# Patient Record
Sex: Female | Born: 1960 | Race: White | Hispanic: No | Marital: Single | Smoking: Never smoker
Health system: Southern US, Community
[De-identification: ages and names within clinical notes are randomized; demographics above are authoritative.]

## PROBLEM LIST (undated history)

## (undated) DIAGNOSIS — I10 Essential (primary) hypertension: Secondary | ICD-10-CM

## (undated) DIAGNOSIS — N200 Calculus of kidney: Secondary | ICD-10-CM

## (undated) HISTORY — PX: TUBAL LIGATION: SHX77

## (undated) HISTORY — PX: OTHER SURGICAL HISTORY: SHX169

---

## 2006-01-05 ENCOUNTER — Ambulatory Visit: Payer: Self-pay | Admitting: Internal Medicine

## 2007-07-24 ENCOUNTER — Ambulatory Visit: Payer: Self-pay | Admitting: Internal Medicine

## 2007-08-15 ENCOUNTER — Ambulatory Visit: Payer: Self-pay | Admitting: Urology

## 2007-09-19 ENCOUNTER — Ambulatory Visit: Payer: Self-pay | Admitting: Urology

## 2009-01-03 IMAGING — CR DG ABDOMEN 1V
1 series · 2 of 2 positions shown · non-contrast
Comparison: none

REASON FOR EXAM: renal calculi-lithotripsy
COMMENTS:

[Series 1: view not recorded · 0.17mm/px · 2 of 2 slices shown]
[im 1/2]
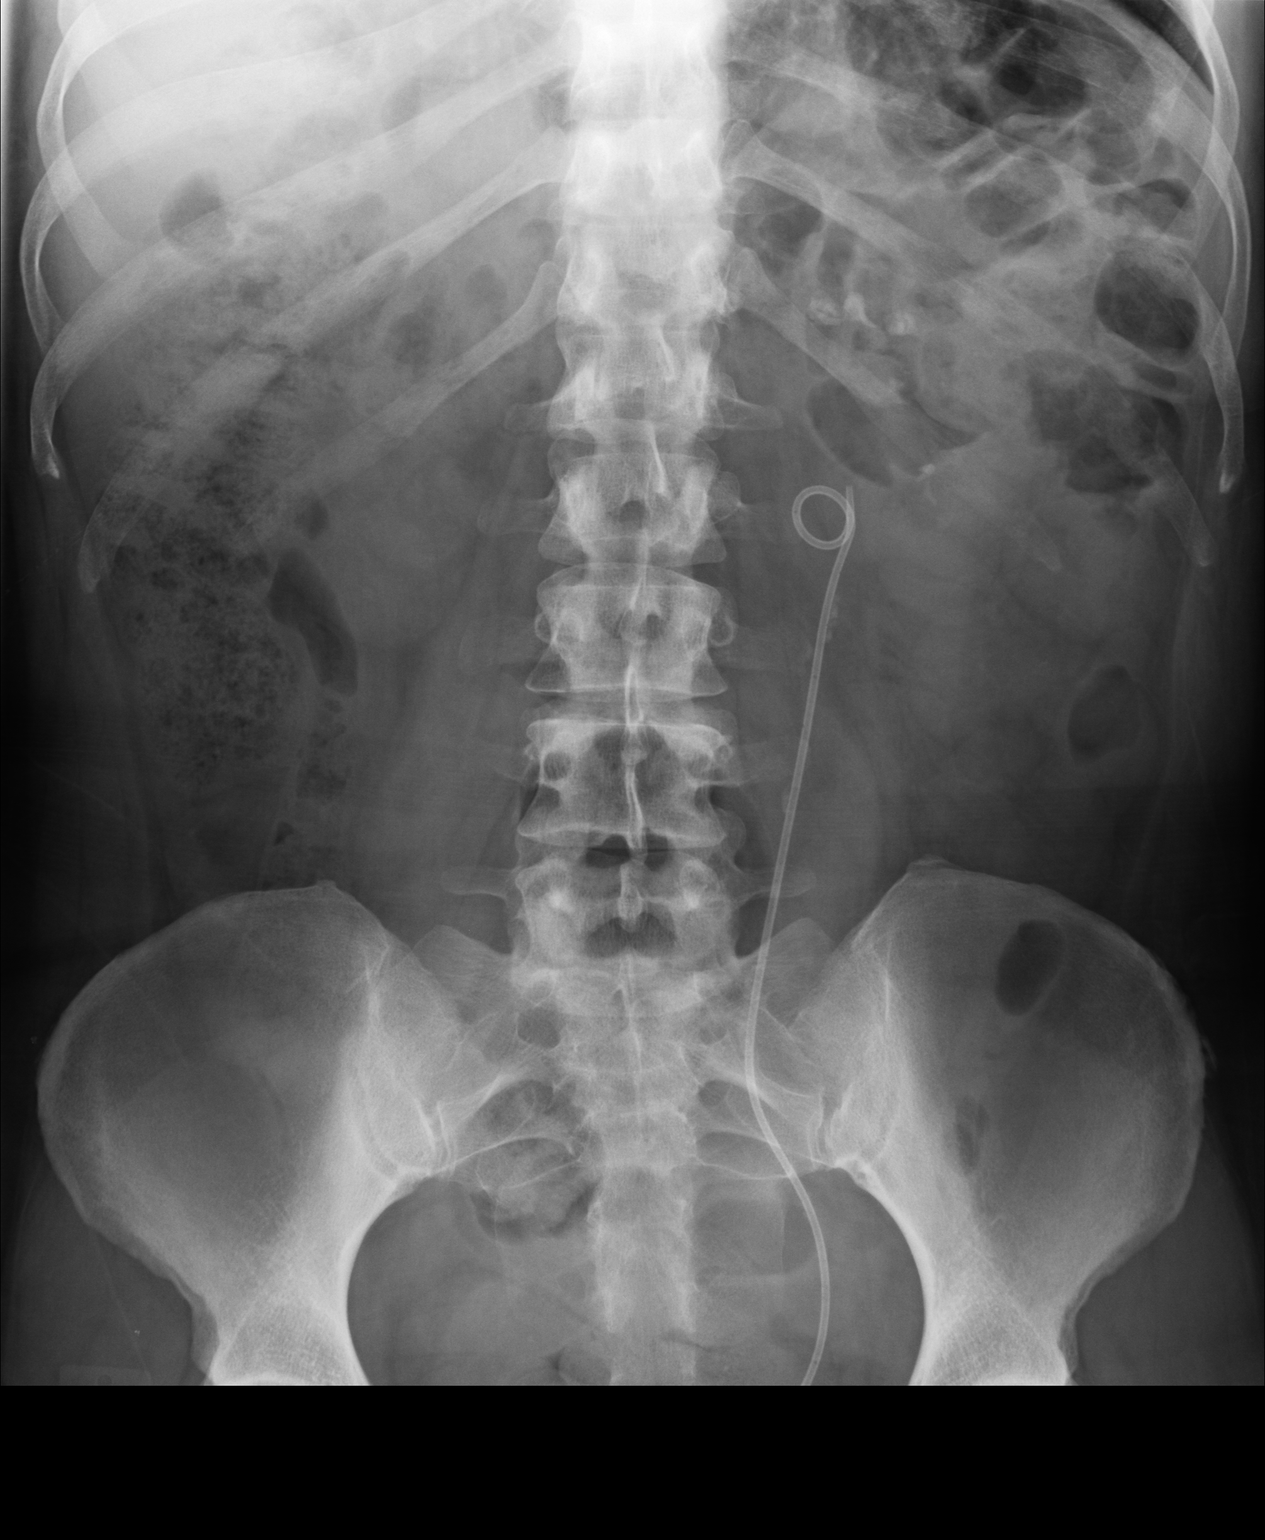
[im 2/2]
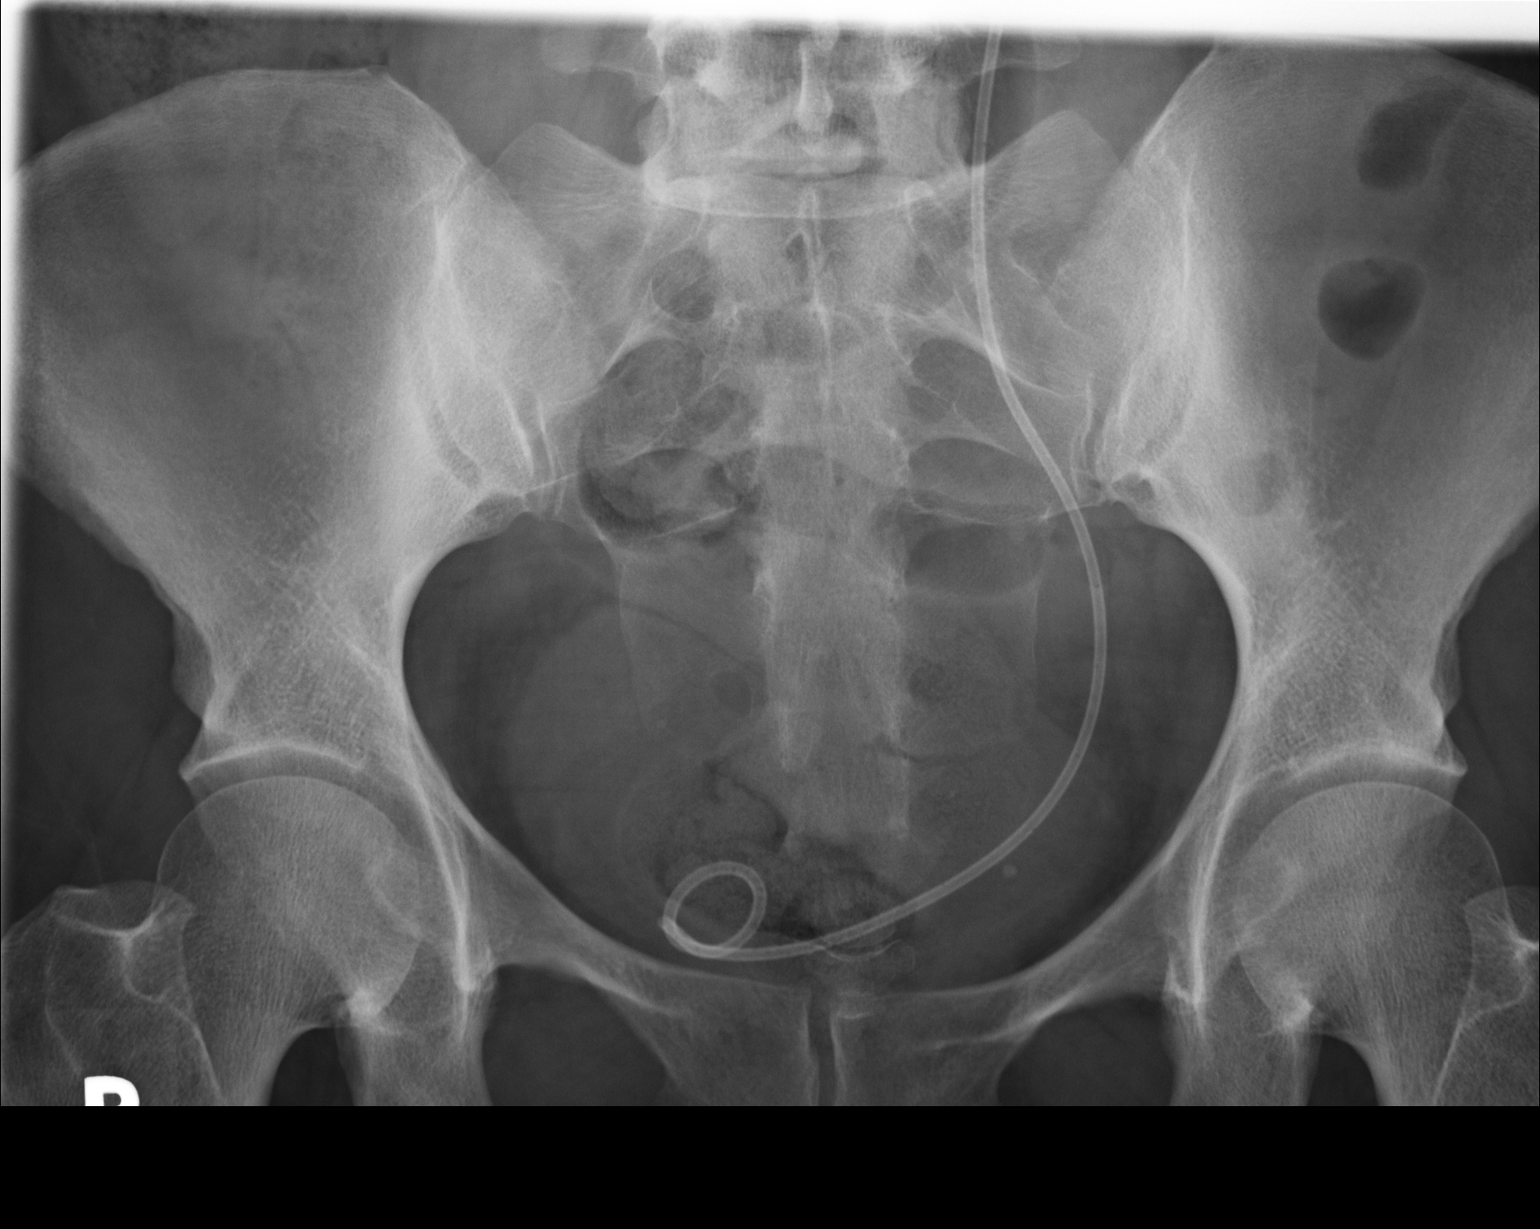

[2 of 2 positions shown; findings below may reference images not displayed]

PROCEDURE:     DXR - DXR KIDNEY URETER BLADDER  - September 19, 2007  [DATE]

RESULT:     Comparison is made to a prior exam of 08/15/2007.  A LEFT
ureteral stent is present. There are noted a few calcifications adjacent to
the stent at the level of the L3 lumbar transverse process and inferior to
the L4 lumbar transverse process. The staghorn calcification at the upper
pole of the LEFT kidney is less prominent although residual calcifications
remain. Additionally, there are a few calcifications noted in the lower pole
region of the LEFT kidney compatible with stone fragments. No renal
calcifications or ureteral calcifications are seen on the RIGHT.
IMPRESSION: 1.     Please see above.

## 2009-07-30 ENCOUNTER — Ambulatory Visit: Payer: Self-pay | Admitting: Internal Medicine

## 2010-02-21 ENCOUNTER — Ambulatory Visit: Payer: Self-pay | Admitting: Internal Medicine

## 2010-06-17 DIAGNOSIS — F3160 Bipolar disorder, current episode mixed, unspecified: Secondary | ICD-10-CM | POA: Insufficient documentation

## 2010-06-17 DIAGNOSIS — G43909 Migraine, unspecified, not intractable, without status migrainosus: Secondary | ICD-10-CM | POA: Insufficient documentation

## 2011-08-28 ENCOUNTER — Ambulatory Visit: Payer: Self-pay

## 2011-08-28 LAB — URINALYSIS, COMPLETE
Ketone: NEGATIVE
Nitrite: NEGATIVE
Ph: 7 (ref 4.5–8.0)
Protein: NEGATIVE
Specific Gravity: 1.005 (ref 1.003–1.030)

## 2011-08-29 LAB — URINE CULTURE

## 2013-07-26 ENCOUNTER — Ambulatory Visit: Payer: Self-pay | Admitting: Family Medicine

## 2013-07-26 LAB — URINALYSIS, COMPLETE
BILIRUBIN, UR: NEGATIVE
GLUCOSE, UR: NEGATIVE mg/dL (ref 0–75)
Ketone: NEGATIVE
NITRITE: POSITIVE
Ph: 7.5 (ref 4.5–8.0)
Protein: NEGATIVE
SPECIFIC GRAVITY: 1.01 (ref 1.003–1.030)
WBC UR: >30 /HPF (ref 0–5)

## 2013-07-28 LAB — URINE CULTURE

## 2014-02-11 ENCOUNTER — Ambulatory Visit: Payer: Self-pay | Admitting: Obstetrics and Gynecology

## 2014-02-11 LAB — CBC
HCT: 38.8 % (ref 35.0–47.0)
HGB: 12.4 g/dL (ref 12.0–16.0)
MCH: 29.3 pg (ref 26.0–34.0)
MCHC: 31.9 g/dL — ABNORMAL LOW (ref 32.0–36.0)
MCV: 92 fL (ref 80–100)
Platelet: 306 10*3/uL (ref 150–440)
RBC: 4.22 10*6/uL (ref 3.80–5.20)
RDW: 13.8 % (ref 11.5–14.5)
WBC: 5.8 10*3/uL (ref 3.6–11.0)

## 2014-02-11 LAB — COMPREHENSIVE METABOLIC PANEL
ALBUMIN: 3.5 g/dL (ref 3.4–5.0)
AST: 15 U/L (ref 15–37)
Alkaline Phosphatase: 74 U/L
Anion Gap: 7 (ref 7–16)
BILIRUBIN TOTAL: 0.4 mg/dL (ref 0.2–1.0)
BUN: 12 mg/dL (ref 7–18)
CHLORIDE: 109 mmol/L — AB (ref 98–107)
CO2: 25 mmol/L (ref 21–32)
Calcium, Total: 8.8 mg/dL (ref 8.5–10.1)
Creatinine: 0.9 mg/dL (ref 0.60–1.30)
EGFR (African American): >60
EGFR (Non-African Amer.): >60
Glucose: 89 mg/dL (ref 65–99)
Osmolality: 280 (ref 275–301)
POTASSIUM: 4 mmol/L (ref 3.5–5.1)
SGPT (ALT): 20 U/L
SODIUM: 141 mmol/L (ref 136–145)
TOTAL PROTEIN: 7.6 g/dL (ref 6.4–8.2)

## 2014-02-19 ENCOUNTER — Ambulatory Visit: Payer: Self-pay | Admitting: Obstetrics and Gynecology

## 2014-02-26 LAB — PATHOLOGY REPORT

## 2014-03-05 LAB — HM COLONOSCOPY

## 2014-05-29 ENCOUNTER — Emergency Department: Payer: Self-pay | Admitting: Emergency Medicine

## 2014-10-14 ENCOUNTER — Emergency Department
Admit: 2014-10-14 | Disposition: A | Discharge status: Home / Self Care | Payer: Self-pay | Admitting: Emergency Medicine

## 2014-11-02 ENCOUNTER — Ambulatory Visit
Admit: 2014-11-02 | Disposition: A | Discharge status: Home / Self Care | Payer: Self-pay | Attending: Family Medicine | Admitting: Family Medicine

## 2014-11-02 LAB — URINALYSIS, COMPLETE
Bilirubin,UR: NEGATIVE
Glucose,UR: NEGATIVE
Ketone: NEGATIVE
NITRITE: NEGATIVE
Ph: 5.5 (ref 5.0–8.0)
Protein: NEGATIVE
Specific Gravity: 1.02 (ref 1.000–1.030)

## 2014-11-04 LAB — URINE CULTURE

## 2014-11-07 NOTE — Op Note (Signed)
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   PROCEDURE: Loop electrosurgical excisional procedure.   ANESTHESIA: General.   SURGEON:  Conard NovakStephen D. Jakyle Petrucelli, Welch.D.   ANESTHESIA: General.   ESTIMATED BLOOD LOSS: Minimal.   OPERATIVE FLUIDS: 600 mL crystalloid.   COMPLICATIONS: None.   FINDINGS: Small area of Lugol's negative cervix at about 12:00.   SPECIMENS: 1. Ectocervix.  2. Endocervix.  3. Endocervical curettings.   CONDITION AT THE END OF THE PROCEDURE: Stable.   PROCEDURE IN DETAIL: The patient was taken to the Operating Room where general anesthesia was administered and found to be adequate. The patient was placed in the dorsal supine high lithotomy position in candy cane stirrups with great care to prevent nerve damage. She was prepped and draped in the usual sterile fashion. After a timeout was called, her bladder was emptied using in and out catheterization for about 100 mL of clear urine. A sterile speculum was placed in the vagina, that is insulated. Lugol's was applied to the cervix with the above-noted findings. The cervix was then injected for a circumferential cervical block using 1% lidocaine with epinephrine, for a total of about 10 mL. Using a large loop, ectocervical specimen was taken in the usual fashion. The 12 o'clock portion of the specimen was tagged with a suture. Endocervical sample was taken using the small wire loop. Endocervical curettings were undertaken using the curette as well as a sterile Q-tip. The cervical LEEP bed was then copiously coagulated using electrocautery with no apparent bleeding. Monsel solution was then applied to the cone bed and no bleeding was evident after watching the cervix for approximately 5 minutes. The  speculum was removed and the vagina was ensured to be clear of all instrumentation, and  sponges as well.   The patient tolerated the procedure well. Sponge, lap, and needle counts were correct x2. For VTE prophylaxis, the patient was wearing pneumatic compression stockings throughout the entire procedure. She was awakened in the Operating Room and taken to the recovery area in stable condition.   ____________________________ Conard NovakStephen D. Jaquail Mclees, MD sdj:dw D: 02/19/2014 14:48:17 ET T: 02/19/2014 19:34:43 ET JOB#: [id number]  cc: Conard NovakStephen D. Awanda Wilcock, MD, <Dictator> Conard NovakSTEPHEN D Azure Budnick MD ELECTRONICALLY SIGNED 04/01/2014 14:45

## 2014-11-25 ENCOUNTER — Other Ambulatory Visit: Payer: Self-pay

## 2014-11-25 DIAGNOSIS — Z1231 Encounter for screening mammogram for malignant neoplasm of breast: Secondary | ICD-10-CM

## 2014-12-10 ENCOUNTER — Ambulatory Visit: Payer: Self-pay

## 2014-12-22 ENCOUNTER — Ambulatory Visit
Admission: RE | Admit: 2014-12-22 | Discharge: 2014-12-22 | Disposition: A | Discharge status: Home / Self Care | Payer: Medicare Other | Source: Ambulatory Visit | Attending: Internal Medicine | Admitting: Internal Medicine

## 2014-12-22 DIAGNOSIS — Z1231 Encounter for screening mammogram for malignant neoplasm of breast: Secondary | ICD-10-CM | POA: Diagnosis not present

## 2015-07-30 ENCOUNTER — Ambulatory Visit
Admission: EM | Admit: 2015-07-30 | Discharge: 2015-07-30 | Disposition: A | Discharge status: Home / Self Care | Payer: Medicare Other | Attending: Family Medicine | Admitting: Family Medicine

## 2015-07-30 DIAGNOSIS — N390 Urinary tract infection, site not specified: Secondary | ICD-10-CM

## 2015-07-30 HISTORY — DX: Calculus of kidney: N20.0

## 2015-07-30 LAB — URINALYSIS COMPLETE WITH MICROSCOPIC (ARMC ONLY)
Bilirubin Urine: NEGATIVE
Glucose, UA: NEGATIVE mg/dL
KETONES UR: NEGATIVE mg/dL
NITRITE: POSITIVE — AB
PH: 6 (ref 5.0–8.0)
Protein, ur: NEGATIVE mg/dL
Specific Gravity, Urine: 1.02 (ref 1.005–1.030)

## 2015-07-30 MED ORDER — NITROFURANTOIN MONOHYD MACRO 100 MG PO CAPS: 100.0000 mg | ORAL_CAPSULE | Freq: Two times a day (BID) | ORAL | Status: DC

## 2015-07-30 NOTE — ED Notes (Signed)
States has Hx of both UTI and Genital Herpes. + burning with voiding

## 2015-07-30 NOTE — ED Provider Notes (Signed)
[facility]  ____________________________________________  Time seen: Approximately 9:53 AM  I have reviewed the triage vital signs and the nursing notes.   HISTORY  Chief Complaint Urinary Tract Infection   HPI Isabel Welch is a 55 y.o. female presents for the complaints of urinary frequency, urinary urgency as well some intermittent burning with urination for the past 4-5 days. Patient states that she feels like she is having he more frequently and has to get to the bathroom quicker. Denies complaints in absence of having to urinate. Reports that she has had a urinary tract infection the past that felt similar. Patient denies any recent urinary tract infections. Denies any recent antibiotic use. Denies any vaginal bleeding, vaginal odor, vaginal discharge. Denies any vaginal rash or lesions. Patient reports that she does have a history of genital herpes but reports that this does not feel like that, patient again denies any rash or lesions. Denies concerns of STDs. Denies chance of pregnancy.  Denies abdominal pain, back pain, nausea, vomiting, fevers, weakness, chest pain or shortness of breath. Reports continues to eat and drink well.  PCP: Welton FlakesKhan   Past Medical History  Diagnosis Date  . Kidney stone     There are no active problems to display for this patient.   Past Surgical History  Procedure Laterality Date  . Leap    . Tubal ligation    . Gallbladder stents      Current Outpatient Rx  Name  Route  Sig  Dispense  Refill  .             Allergies Review of patient's allergies indicates no known allergies.  Family History  Problem Relation Age of Onset  . Cancer Mother   . Heart failure Father     Social History Social History  Substance Use Topics  . Smoking status: Never Smoker   . Smokeless tobacco: None  . Alcohol Use: No    Review of Systems Constitutional: No fever/chills Eyes: No visual changes. ENT: No sore  throat. Cardiovascular: Denies chest pain. Respiratory: Denies shortness of breath. Gastrointestinal: No abdominal pain.  No nausea, no vomiting.  No diarrhea.  No constipation. Genitourinary: Positive for dysuria. Musculoskeletal: Negative for back pain. Skin: Negative for rash. Neurological: Negative for headaches, focal weakness or numbness.  10-point ROS otherwise negative.  ____________________________________________   PHYSICAL EXAM:  VITAL SIGNS: ED Triage Vitals  Enc Vitals Group     BP 07/30/15 0927 115/62 mmHg     Pulse Rate 07/30/15 0927 79     Resp 07/30/15 0927 16     Temp 07/30/15 0927 98.1 F (36.7 C)     Temp Source 07/30/15 0927 Tympanic     SpO2 07/30/15 0927 100 %     Weight 07/30/15 0927 174 lb (78.926 kg)     Height 07/30/15 0927 5\' 3"  (1.6 m)     Head Cir --      Peak Flow --      Pain Score --      Pain Loc --      Pain Edu? --      Excl. in GC? --     Constitutional: Alert and oriented. Well appearing and in no acute distress. Eyes: Conjunctivae are normal. PERRL. EOMI. Head: Atraumatic.  Nose: No congestion/rhinnorhea.  Mouth/Throat: Mucous membranes are moist. Hematological/Lymphatic/Immunilogical: No cervical lymphadenopathy. Cardiovascular: Normal rate, regular rhythm. Grossly normal heart sounds.  Good peripheral circulation. Respiratory: Normal respiratory effort.  No retractions. Lungs CTAB.  Gastrointestinal: Soft and nontender. No distention. Normal Bowel sounds. . No CVA tenderness. Pelvic: Patient refused. Musculoskeletal: No lower or upper extremity tenderness nor edema.  No cervical, thoracic or lumbar tenderness to palpation. Neurologic:  Normal speech and language. No gross focal neurologic deficits are appreciated. No gait instability. Skin:  Skin is warm, dry and intact. No rash noted. Psychiatric: Mood and affect are normal. Speech and behavior are normal.  ____________________________________________   LABS (all labs  ordered are listed, but only abnormal results are displayed)  Labs Reviewed  URINALYSIS COMPLETEWITH MICROSCOPIC ([facility] ONLY) - Abnormal; Notable for the following:    APPearance HAZY (*)    Hgb urine dipstick TRACE (*)    Nitrite POSITIVE (*)    Leukocytes, UA TRACE (*)    Bacteria, UA MANY (*)    Squamous Epithelial / LPF 0-5 (*)    All other components within normal limits  URINE CULTURE    INITIAL IMPRESSION / ASSESSMENT AND PLAN / ED COURSE  Pertinent labs & imaging results that were available during my care of the patient were reviewed by me and considered in my medical decision making (see chart for details).  Very well-appearing patient. No acute distress. Presents for the complaints of urinary dysuria 4-5 days. Denies fevers. Denies vaginal complaints, vaginal rash or lesions. Denies abdominal pain. Reports continues to eat and drink well. Abdomen soft and nontender. Moist mucous membranes. Urinalysis positive for many bacteria, trace leukocytes, positive nitrite. Will culture urine. Will treat urinary tract infection with oral Macrobid, encouraged rest, fluids and PCP follow up. Patient follow-up next week with PCP to ensure clearance of urinary tract infection.  Discussed follow up with Primary care physician this week. Discussed follow up and return parameters including no resolution or any worsening concerns. Patient verbalized understanding and agreed to plan.   ____________________________________________   FINAL CLINICAL IMPRESSION(S) / ED DIAGNOSES  Final diagnoses:  UTI (lower urinary tract infection)       Renford Dills, NP 07/30/15 1028

## 2015-07-30 NOTE — Discharge Instructions (Signed)
Rest. Drink plenty of fluids.   Follow up with your primary care physician next week as discussed. Return to Urgent care for new or worsening concerns.   Urinary Tract Infection Urinary tract infections (UTIs) can develop anywhere along your urinary tract. Your urinary tract is your body's drainage system for removing wastes and extra water. Your urinary tract includes two kidneys, two ureters, a bladder, and a urethra. Your kidneys are a pair of bean-shaped organs. Each kidney is about the size of your fist. They are located below your ribs, one on each side of your spine. CAUSES Infections are caused by microbes, which are microscopic organisms, including fungi, viruses, and bacteria. These organisms are so small that they can only be seen through a microscope. Bacteria are the microbes that most commonly cause UTIs. SYMPTOMS  Symptoms of UTIs may vary by age and gender of the patient and by the location of the infection. Symptoms in young women typically include a frequent and intense urge to urinate and a painful, burning feeling in the bladder or urethra during urination. Older women and men are more likely to be tired, shaky, and weak and have muscle aches and abdominal pain. A fever may mean the infection is in your kidneys. Other symptoms of a kidney infection include pain in your back or sides below the ribs, nausea, and vomiting. DIAGNOSIS To diagnose a UTI, your caregiver will ask you about your symptoms. Your caregiver will also ask you to provide a urine sample. The urine sample will be tested for bacteria and white blood cells. White blood cells are made by your body to help fight infection. TREATMENT  Typically, UTIs can be treated with medication. Because most UTIs are caused by a bacterial infection, they usually can be treated with the use of antibiotics. The choice of antibiotic and length of treatment depend on your symptoms and the type of bacteria causing your infection. HOME CARE  INSTRUCTIONS  If you were prescribed antibiotics, take them exactly as your caregiver instructs you. Finish the medication even if you feel better after you have only taken some of the medication.  Drink enough water and fluids to keep your urine clear or pale yellow.  Avoid caffeine, tea, and carbonated beverages. They tend to irritate your bladder.  Empty your bladder often. Avoid holding urine for long periods of time.  Empty your bladder before and after sexual intercourse.  After a bowel movement, women should cleanse from front to back. Use each tissue only once. SEEK MEDICAL CARE IF:   You have back pain.  You develop a fever.  Your symptoms do not begin to resolve within 3 days. SEEK IMMEDIATE MEDICAL CARE IF:   You have severe back pain or lower abdominal pain.  You develop chills.  You have nausea or vomiting.  You have continued burning or discomfort with urination. MAKE SURE YOU:   Understand these instructions.  Will watch your condition.  Will get help right away if you are not doing well or get worse.   This information is not intended to replace advice given to you by your health care provider. Make sure you discuss any questions you have with your health care provider.   Document Released: 04/12/2005 Document Revised: 03/24/2015 Document Reviewed: 08/11/2011 Elsevier Interactive Patient Education [employer]2016 Elsevier Inc.

## 2015-08-01 LAB — URINE CULTURE: SPECIAL REQUESTS: NORMAL

## 2015-12-02 ENCOUNTER — Encounter: Payer: Self-pay | Admitting: Podiatry

## 2015-12-02 ENCOUNTER — Ambulatory Visit (INDEPENDENT_AMBULATORY_CARE_PROVIDER_SITE_OTHER): Payer: Medicare Other | Admitting: Podiatry

## 2015-12-02 VITALS — BP 141/89 | HR 70 | Resp 18

## 2015-12-02 DIAGNOSIS — L603 Nail dystrophy: Secondary | ICD-10-CM | POA: Diagnosis not present

## 2015-12-02 MED ORDER — UREA 40 % EX GEL: 1.0000 "application " | Freq: Every day | CUTANEOUS | Status: DC

## 2015-12-02 NOTE — Progress Notes (Signed)
   Subjective:    Patient ID: Isabel Welch, female    DOB: 09-20-1960, 55 y.o.   MRN: [id number]  HPI  55 year old female presents the office they for concerns of thick, painful, elongated toenails right second digit toenail. She has tried over-the-counter treatment any relief. Should discuss treatment options for the thick toenails. No swelling redness or drainage. No other complaints.   Review of Systems  All other systems reviewed and are negative.      Objective:   Physical Exam General: AAO x3, NAD  Dermatological: Nails are very somewhat dystrophic, discolored, hypertrophic particularly the right second digit toenail. This tenderness to the nails. No surrounding redness or drainage. No open lesions.  Vascular: Dorsalis Pedis artery and Posterior Tibial artery pedal pulses are 2/4 bilateral with immedate capillary fill time. Pedal hair growth present. No varicosities and no lower extremity edema present bilateral. There is no pain with calf compression, swelling, warmth, erythema.   Neruologic: Grossly intact via light touch bilateral. Vibratory intact via tuning fork bilateral. Protective threshold with Semmes Wienstein monofilament intact to all pedal sites bilateral. Patellar and Achilles deep tendon reflexes 2+ bilateral.   Musculoskeletal: No gross boney pedal deformities bilateral. No pain, crepitus, or limitation noted with foot and ankle range of motion bilateral. Muscular strength 5/5 in all groups tested bilateral.  Gait: Unassisted, Nonantalgic.     Assessment & Plan:  55 year old female onychodystrophy, likely onychomycosis -Treatment options discussed including all alternatives, risks, and complications -Etiology of symptoms were discussed -The nails were debrided and [facility] -Discussed treatment options for onychomycosis but we will await the results of the culture before proceeding. Went ahead and started urea gel which was  ordered today. -Follow-up after nail culture or sooner if any problems arise. In the meantime, encouraged to call the office with any questions, concerns, change in symptoms.   Isabel Welch, DPM

## 2015-12-03 NOTE — Addendum Note (Signed)
Addended by: Hadley PenOX, Rosha Cocker R on: 12/03/2015 11:52 AM   Modules accepted: Orders

## 2015-12-21 ENCOUNTER — Telehealth: Payer: Self-pay | Admitting: *Deleted

## 2015-12-21 NOTE — Telephone Encounter (Signed)
I CALLED PATIENT TODAY AND STATED THAT THE NAIL CULTURE WAS NEGATIVE AND THAT THE PATIENT COULD TRY UREA CREAM WHICH SHE COULD GET HERE OR TRY AN OVER THE COUNTER CREAM AND IF THAT DID NOT WORK TO COME BACK TO THE OFFICE AND GET THE ONE WITH THE STONE ON TOP AND TO CALL WITH ANY QUESTIONS OR CONCERNS. Isabel Welch

## 2015-12-28 ENCOUNTER — Telehealth: Payer: Self-pay | Admitting: *Deleted

## 2015-12-28 NOTE — Telephone Encounter (Signed)
Dr. Ardelle AntonWagoner reviewed fungal culture 12/02/2015 as negative, recommends Urea Cream.  Left message for pt to call for results.

## 2017-04-24 ENCOUNTER — Other Ambulatory Visit: Payer: Self-pay | Admitting: Internal Medicine

## 2017-05-09 ENCOUNTER — Telehealth: Payer: Self-pay | Admitting: Obstetrics & Gynecology

## 2017-05-09 NOTE — Telephone Encounter (Signed)
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

## 2017-05-10 NOTE — Telephone Encounter (Signed)
Pt is schedule 05/21/17

## 2017-05-21 ENCOUNTER — Ambulatory Visit: Payer: Medicare Other | Admitting: Obstetrics and Gynecology

## 2017-06-29 NOTE — Telephone Encounter (Signed)
Called and left voicemail for patient to call back to be schedule. Pt is showing active for state regular medicaid [place]

## 2017-07-03 NOTE — Telephone Encounter (Signed)
[facility]mb not setup. Will send a letter

## 2017-07-26 ENCOUNTER — Other Ambulatory Visit: Payer: Self-pay | Admitting: Internal Medicine

## 2017-07-26 DIAGNOSIS — Z1231 Encounter for screening mammogram for malignant neoplasm of breast: Secondary | ICD-10-CM

## 2018-01-03 ENCOUNTER — Other Ambulatory Visit: Payer: Self-pay | Admitting: Internal Medicine

## 2018-01-03 DIAGNOSIS — R1084 Generalized abdominal pain: Secondary | ICD-10-CM

## 2018-01-07 ENCOUNTER — Ambulatory Visit
Admission: EM | Admit: 2018-01-07 | Discharge: 2018-01-07 | Disposition: A | Discharge status: Home / Self Care | Payer: Medicare Other | Attending: Internal Medicine | Admitting: Internal Medicine

## 2018-01-07 DIAGNOSIS — H5711 Ocular pain, right eye: Secondary | ICD-10-CM

## 2018-01-07 DIAGNOSIS — H5789 Other specified disorders of eye and adnexa: Secondary | ICD-10-CM | POA: Diagnosis not present

## 2018-01-07 MED ORDER — SODIUM CHLORIDE 0.9 % IR SOLN
1000.0000 mL | Freq: Once | Status: AC
  Administered 2018-01-07: 1000 mL

## 2018-01-07 MED ORDER — KETOTIFEN FUMARATE 0.025 % OP SOLN: 2.0000 [drp] | Freq: Two times a day (BID) | OPHTHALMIC | 0 refills | Status: DC

## 2018-01-07 MED ORDER — KETOTIFEN FUMARATE 0.025 % OP SOLN: 1.0000 [drp] | Freq: Two times a day (BID) | OPHTHALMIC | 0 refills | Status: AC

## 2018-01-07 MED ORDER — SULFACETAMIDE SODIUM 10 % OP SOLN: 1.0000 [drp] | OPHTHALMIC | 0 refills | Status: DC

## 2018-01-07 MED ORDER — SULFACETAMIDE SODIUM 10 % OP SOLN: 1.0000 [drp] | OPHTHALMIC | 0 refills | Status: AC

## 2018-01-07 NOTE — ED Triage Notes (Signed)
Pt has gotten something in her right eye on Saturday and thought it was a eyelash and has gotten worse since. Eye is red and irritated. Did say she cleans with baking soda and didn't have on gloves thinks she could have rubbed some in on accident. Does get better at night and then the pain and redness returns after she gets up.

## 2018-01-07 NOTE — ED Provider Notes (Signed)
[facility]    CSN: [id number] Arrival date & time: 01/07/18  1642     History   Chief Complaint Chief Complaint  Patient presents with  . Foreign Body in Eye    HPI Isabel Welch is a 57 y.o. female.   2 days ago she felt like she got an eyelash in her right eye and rubbed it, had been cleaning things with baking soda and worried that she might have gotten a little baking soda into her eye.  Right eye has been red/irritated, watery, since.  Does not really hurt, just feels irritated.  Has a foreign body sensation.  Is a little better when she gets up in the morning and then gets worse during the day.    HPI  Past Medical History:  Diagnosis Date  . Kidney stone     Patient Active Problem List   Diagnosis Date Noted  . Bipolar affective disorder, mixed (HCC) 06/17/2010  . Migraine 06/17/2010    Past Surgical History:  Procedure Laterality Date  . gallbladder stents    . LEAP    . TUBAL LIGATION       Home Medications    Prior to Admission medications   Medication Sig Start Date End Date Taking? Authorizing Provider  amLODipine (NORVASC) 2.5 MG tablet TAKE 1 TABLET BY MOUTH ONCE DAILY (NEEDS APPOINTMENT FOR FURTHER REFILLS) 01/01/18  Yes [provider]  ARIPiprazole (ABILIFY) 5 MG tablet Take 5 mg by mouth daily. 12/17/17  Yes [provider]  omeprazole (PRILOSEC) 40 MG capsule Take 40 mg by mouth daily. 01/03/18  Yes [provider]  traZODone (DESYREL) 100 MG tablet  11/11/15  Yes [provider]  ketotifen (ZADITOR) 0.025 % ophthalmic solution Place 1 drop into the right eye 2 (two) times daily for 7 days. 01/07/18 01/14/18  Isa Rankin, MD  nitrofurantoin, macrocrystal-monohydrate, (MACROBID) 100 MG capsule Take 1 capsule (100 mg total) by mouth 2 (two) times daily. 07/30/15   Renford Dills, NP  sulfacetamide (BLEPH-10) 10 % ophthalmic solution Place 1-2 drops into the right eye every 2 (two) hours while  awake for 5 days. 01/07/18 01/12/18  Isa Rankin, MD  Urea 40 % GEL Apply 1 application topically daily. 12/02/15   Vivi Barrack, DPM    Family History Family History  Problem Relation Age of Onset  . Cancer Mother   . Heart failure Father     Social History Social History   Tobacco Use  . Smoking status: Never Smoker  . Smokeless tobacco: Never Used  Substance Use Topics  . Alcohol use: No  . Drug use: Not on file     Allergies   Patient has no known allergies.   Review of Systems Review of Systems  All other systems reviewed and are negative.    Physical Exam Triage Vital Signs ED Triage Vitals  Enc Vitals Group     BP 01/07/18 1727 (!) 125/92     Pulse Rate 01/07/18 1727 80     Resp 01/07/18 1727 18     Temp 01/07/18 1727 99 F (37.2 C)     Temp Source 01/07/18 1727 Oral     SpO2 01/07/18 1727 99 %     Weight --      Height --      Pain Score 01/07/18 1729 0     Pain Loc --    Updated Vital Signs BP (!) 125/92 (BP Location: Left Arm)  Pulse 80   Temp 99 F (37.2 C) (Oral)   Resp 18   SpO2 99%   Visual Acuity Right Eye Distance: 20/40 Left Eye Distance: 20/40 Bilateral Distance:    Physical Exam  Constitutional: She is oriented to person, place, and time. No distress.  HENT:  Head: Atraumatic.  Eyes: Pupils are equal, round, and reactive to light. EOM are normal.  Right conjunctiva is diffusely injected No corneal irregularity appreciated on penlight exam, no dye uptake appreciated on fluorescein exam.  No foreign body visible under the lower lid, upper lid is puffy/slightly red and unable to fully evert it.    Neck: Neck supple.  Cardiovascular: Normal rate.  Pulmonary/Chest: No respiratory distress.  Abdominal: She exhibits no distension.  Musculoskeletal: Normal range of motion.  Neurological: She is alert and oriented to person, place, and time.  Skin: Skin is warm and dry.  Nursing note and vitals reviewed.    UC  Treatments / Results  Labs (all labs ordered are listed, but only abnormal results are displayed) Labs Reviewed - No data to display  EKG None  Radiology [place] Abdomen Complete  Result Date: 01/08/2018 CLINICAL DATA:  Two weeks of generalized abdominal pain associated with nausea and vomiting. History of kidney stones with lithotripsy in the past. EXAM: ABDOMEN ULTRASOUND COMPLETE COMPARISON:  KUB of September 19, 2007 FINDINGS: Gallbladder: No gallstones or wall thickening visualized. No sonographic Murphy sign noted by sonographer. Common bile duct: Diameter: 3.1 mm Liver: No focal lesion identified. Within normal limits in parenchymal echogenicity. Portal vein is patent on color Doppler imaging with normal direction of blood flow towards the liver. IVC: No abnormality visualized. Pancreas: Visualized portion unremarkable. Spleen: Size and appearance within normal limits. Right Kidney: Length: 11.0 cm. Echogenicity within normal limits. No mass or hydronephrosis visualized. Left Kidney: Length: 11.9 cm. Echogenicity within normal limits. No mass or hydronephrosis visualized. Abdominal aorta: No aneurysm visualized. Other findings: None. IMPRESSION: No gallstones nor other acute intra-abdominal abnormality. Electronically Signed   By: David  SwazilandJordan M.D.   On: 01/08/2018 11:28    Procedures Procedures (including critical care time)  Medications Ordered in UC Medications  sodium chloride irrigation 0.9 % 1,000 mL (1,000 mLs Irrigation Given 01/07/18 1904)     Final Clinical Impressions(s) / UC Diagnoses   Final diagnoses:  Irritation of right eye  concern for occult foreign body vs upper lid stye   Discharge Instructions     No specific cause of right eye irritation was seen on exam and vision was 20/40 in both eyes.  Prescription for ketotifen eye drops, to decrease irritation, and sulfacetamide eye drops (in case of infection) were sent to the pharmacy.  Please followup with Dr Gerome SamPorfilio's  office tomorrow after 1pm to recheck right eye for possible foreign body or stye.      Meds ordered this encounter  Medications  . ketotifen (ZADITOR) 0.025 % ophthalmic solution    Sig: Place 1 drop into the right eye 2 (two) times daily for 7 days.    Dispense:  5 mL    Refill:  0  . sulfacetamide (BLEPH-10) 10 % ophthalmic solution    Sig: Place 1-2 drops into the right eye every 2 (two) hours while awake for 5 days.    Dispense:  4.5 mL    Refill:  0      Isa RankinMurray, Laura Wilson, MD 01/08/18 1150

## 2018-01-07 NOTE — Discharge Instructions (Addendum)
No specific cause of right eye irritation was seen on exam and vision was 20/40 in both eyes.  Prescription for ketotifen eye drops, to decrease irritation, and sulfacetamide eye drops (in case of infection) were sent to the pharmacy.  Please followup with Dr Gerome SamPorfilio's office tomorrow after 1pm to recheck right eye for possible foreign body or stye.

## 2018-01-08 ENCOUNTER — Ambulatory Visit
Admission: RE | Admit: 2018-01-08 | Discharge: 2018-01-08 | Disposition: A | Discharge status: Home / Self Care | Payer: Medicare Other | Source: Ambulatory Visit | Attending: Internal Medicine | Admitting: Internal Medicine

## 2018-01-08 DIAGNOSIS — R1084 Generalized abdominal pain: Secondary | ICD-10-CM | POA: Insufficient documentation

## 2018-01-22 ENCOUNTER — Encounter: Payer: Self-pay | Admitting: Emergency Medicine

## 2018-01-22 ENCOUNTER — Emergency Department: Payer: Medicare Other

## 2018-01-22 ENCOUNTER — Other Ambulatory Visit: Payer: Self-pay

## 2018-01-22 ENCOUNTER — Emergency Department
Admission: EM | Admit: 2018-01-22 | Discharge: 2018-01-22 | Disposition: A | Discharge status: Home / Self Care | Payer: Medicare Other | Attending: Emergency Medicine | Admitting: Emergency Medicine

## 2018-01-22 DIAGNOSIS — N200 Calculus of kidney: Secondary | ICD-10-CM | POA: Insufficient documentation

## 2018-01-22 DIAGNOSIS — R1084 Generalized abdominal pain: Secondary | ICD-10-CM | POA: Diagnosis present

## 2018-01-22 HISTORY — DX: Essential (primary) hypertension: I10

## 2018-01-22 LAB — COMPREHENSIVE METABOLIC PANEL
ALK PHOS: 65 U/L (ref 38–126)
ALT: 17 U/L (ref 0–44)
ANION GAP: 8 (ref 5–15)
AST: 24 U/L (ref 15–41)
Albumin: 4.2 g/dL (ref 3.5–5.0)
BILIRUBIN TOTAL: 0.5 mg/dL (ref 0.3–1.2)
BUN: 11 mg/dL (ref 6–20)
CALCIUM: 8.9 mg/dL (ref 8.9–10.3)
CO2: 26 mmol/L (ref 22–32)
Chloride: 106 mmol/L (ref 98–111)
Creatinine, Ser: 0.92 mg/dL (ref 0.44–1.00)
GFR calc Af Amer: >60 mL/min (ref >60)
GFR calc non Af Amer: >60 mL/min (ref >60)
GLUCOSE: 145 mg/dL — AB (ref 70–99)
POTASSIUM: 3.8 mmol/L (ref 3.5–5.1)
Sodium: 140 mmol/L (ref 135–145)
Total Protein: 7.2 g/dL (ref 6.5–8.1)

## 2018-01-22 LAB — CBC
HEMATOCRIT: 37.7 % (ref 35.0–47.0)
HEMOGLOBIN: 12.9 g/dL (ref 12.0–16.0)
MCH: 30.6 pg (ref 26.0–34.0)
MCHC: 34.3 g/dL (ref 32.0–36.0)
MCV: 89 fL (ref 80.0–100.0)
Platelets: 325 10*3/uL (ref 150–440)
RBC: 4.23 MIL/uL (ref 3.80–5.20)
RDW: 13.4 % (ref 11.5–14.5)
WBC: 7.3 10*3/uL (ref 3.6–11.0)

## 2018-01-22 LAB — URINALYSIS, COMPLETE (UACMP) WITH MICROSCOPIC
BILIRUBIN URINE: NEGATIVE
GLUCOSE, UA: NEGATIVE mg/dL
Ketones, ur: 5 mg/dL — AB
Leukocytes, UA: NEGATIVE
Nitrite: NEGATIVE
PROTEIN: NEGATIVE mg/dL
RBC / HPF: >50 RBC/hpf — ABNORMAL HIGH (ref 0–5)
Specific Gravity, Urine: 1.011 (ref 1.005–1.030)
pH: 6 (ref 5.0–8.0)

## 2018-01-22 LAB — LIPASE, BLOOD: Lipase: 46 U/L (ref 11–51)

## 2018-01-22 MED ORDER — ONDANSETRON HCL 4 MG/2ML IJ SOLN
4.0000 mg | Freq: Once | INTRAMUSCULAR | Status: AC | PRN
  Administered 2018-01-22: 4 mg via INTRAVENOUS
  Filled 2018-01-22: qty 2

## 2018-01-22 MED ORDER — FENTANYL CITRATE (PF) 100 MCG/2ML IJ SOLN
50.0000 ug | INTRAMUSCULAR | Status: DC | PRN
  Administered 2018-01-22: 50 ug via INTRAVENOUS
  Filled 2018-01-22: qty 2

## 2018-01-22 NOTE — ED Notes (Addendum)
Pt reports waking up like normal, using the restroom then feeling a sharp pain on her left flank that wrapped around to left groin. Reported 10/10 pain at the time. Fentanyl given in triage brought pain to 0/10 (see pain assessment).   Pt states she is unable to urinate at this time; understands we need urine specimen.

## 2018-01-22 NOTE — ED Notes (Signed)
ED Provider at bedside. 

## 2018-01-22 NOTE — ED Notes (Signed)
Patient transported to CT 

## 2018-01-22 NOTE — ED Notes (Signed)
Pt ambulatory upon discharge; declined wheel chair. Pt verbalized understanding of discharge instructions and follow-up care. This RN walked pt out to lobby.

## 2018-01-22 NOTE — ED Triage Notes (Signed)
Sudden onset left flank and left lower quad pain with nausea bout 30 min ago.

## 2018-01-22 NOTE — ED Provider Notes (Signed)
[facility] Emergency Department Provider Note  Time seen: 1:28 PM  I have reviewed the triage vital signs and the nursing notes.   HISTORY  Chief Complaint Flank Pain and Abdominal Pain    HPI Isabel Welch is a 57 y.o. female with a past medical history of hypertension, bipolar, kidney stone, presents to the emergency department for left flank pain.  According to the patient she woke up feeling very normal and well this morning however acutely around 9 AM developed left flank/left lower quadrant pain.  States it was sharp/cramping type pain.  States he felt very nauseated but denies any vomiting.  Denies any fever, cough or congestion.  Denies diarrhea, states normal bowel movement this morning.  Largely negative review of systems.  She did states she felt like she had to urinate but could not produce any urine this morning.   Past Medical History:  Diagnosis Date  . Hypertension   . Kidney stone     Patient Active Problem List   Diagnosis Date Noted  . Bipolar affective disorder, mixed (HCC) 06/17/2010  . Migraine 06/17/2010    Past Surgical History:  Procedure Laterality Date  . gallbladder stents    . LEAP    . TUBAL LIGATION      Prior to Admission medications   Medication Sig Start Date End Date Taking? Authorizing Provider  amLODipine (NORVASC) 2.5 MG tablet TAKE 1 TABLET BY MOUTH ONCE DAILY (NEEDS APPOINTMENT FOR FURTHER REFILLS) 01/01/18   [provider]  ARIPiprazole (ABILIFY) 5 MG tablet Take 5 mg by mouth daily. 12/17/17   [provider]  nitrofurantoin, macrocrystal-monohydrate, (MACROBID) 100 MG capsule Take 1 capsule (100 mg total) by mouth 2 (two) times daily. 07/30/15   Renford DillsMiller, Lindsey, NP  omeprazole (PRILOSEC) 40 MG capsule Take 40 mg by mouth daily. 01/03/18   [provider]  traZODone (DESYREL) 100 MG tablet  11/11/15   [provider]  Urea 40 % GEL Apply 1 application topically daily.  12/02/15   Vivi BarrackWagoner, Matthew R, DPM    No Known Allergies  Family History  Problem Relation Age of Onset  . Cancer Mother   . Heart failure Father     Social History Social History   Tobacco Use  . Smoking status: Never Smoker  . Smokeless tobacco: Never Used  Substance Use Topics  . Alcohol use: No  . Drug use: Not on file    Review of Systems Constitutional: Negative for fever. Cardiovascular: Negative for chest pain. Respiratory: Negative for shortness of breath. Gastrointestinal: Left flank pain.  Positive nausea but negative for vomiting or diarrhea Genitourinary: States difficulty urinating this morning. Musculoskeletal: Negative for musculoskeletal complaints Skin: Negative for skin complaints  Neurological: Negative for headache All other ROS negative  ____________________________________________   PHYSICAL EXAM:  VITAL SIGNS: ED Triage Vitals [01/22/18 1114]  Enc Vitals Group     BP (!) 96/53     Pulse Rate (!) 49     Resp 16     Temp 97.6 F (36.4 C)     Temp Source Oral     SpO2 100 %     Weight 178 lb (80.7 kg)     Height 5\' 3"  (1.6 m)     Head Circumference      Peak Flow      Pain Score 10     Pain Loc      Pain Edu?      Excl. in [place]?  Constitutional: Alert and oriented. Well appearing and in no distress. Eyes: Normal exam ENT   Head: Normocephalic and atraumatic.   Mouth/Throat: Mucous membranes are moist. Cardiovascular: Normal rate, regular rhythm. No murmur Respiratory: Normal respiratory effort without tachypnea nor retractions. Breath sounds are clear Gastrointestinal: Soft and nontender. No distention.   Musculoskeletal: Nontender with normal range of motion in all extremities.  Neurologic:  Normal speech and language. No gross focal neurologic deficits Skin:  Skin is warm, dry and intact.  Psychiatric: Mood and affect are normal.   ____________________________________________    RADIOLOGY  4 mm bladder stone, with  fullness of the left collecting system.  ____________________________________________   INITIAL IMPRESSION / ASSESSMENT AND PLAN / ED COURSE  Pertinent labs & imaging results that were available during my care of the patient were reviewed by me and considered in my medical decision making (see chart for details).  Patient presents to the emergency department with acute onset of left lower quadrant/left flank pain this morning along with nausea.  Differential would include ureterolithiasis, pyelonephritis, diverticulitis.  Patient states after receiving pain medication in the emergency department her pain is completely resolved.  Currently the patient appears well.  Blood work is largely within normal limits, urinalysis pending, will order a CT renal scan to further evaluate.  4 mm stone seen in the bladder, fullness of the left collecting system likely recently passed left-sided ureteral stone.  Shortly after returning from CT scan the patient urinated in the room and passed a small kidney stone.  No other stones seen on CT.  We will discharge the patient home.  She is 100% pain-free.  Urine culture has been sent.  ____________________________________________   FINAL CLINICAL IMPRESSION(S) / ED DIAGNOSES  Left flank pain Kidney stone   Minna Antis, MD 01/22/18 1452

## 2018-01-23 LAB — URINE CULTURE

## 2019-05-09 IMAGING — CT CT RENAL STONE PROTOCOL
2 of 4 series · 14 of 46 positions shown, 16 images · non-contrast
Comparison: 07/24/2007

CLINICAL DATA: Sharp pain in the left flank wrapping to the left
groin.

EXAM:
CT ABDOMEN AND PELVIS WITHOUT CONTRAST
TECHNIQUE: Multidetector CT imaging of the abdomen and pelvis was performed
following the standard protocol without IV contrast.

[Series 2: stone full standard · axial · 0.76mm/px · z∈[-508,-128]mm · 11 of 88 slices shown, 13 images]
[im 8/88  soft-tissue]
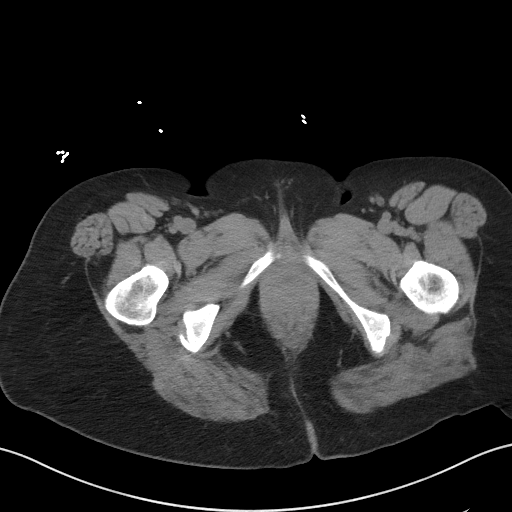
[im 8/88  bone]
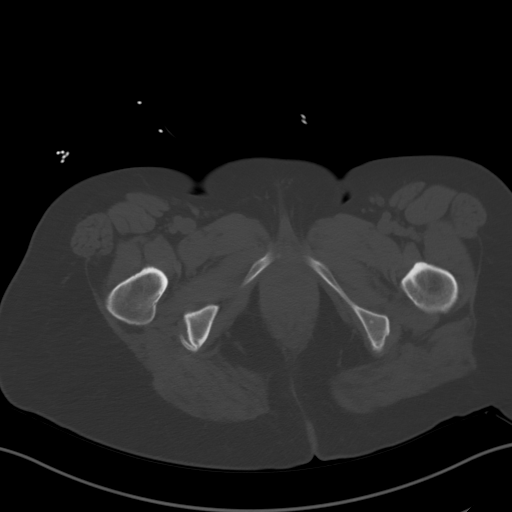
[im 16/88  soft-tissue]
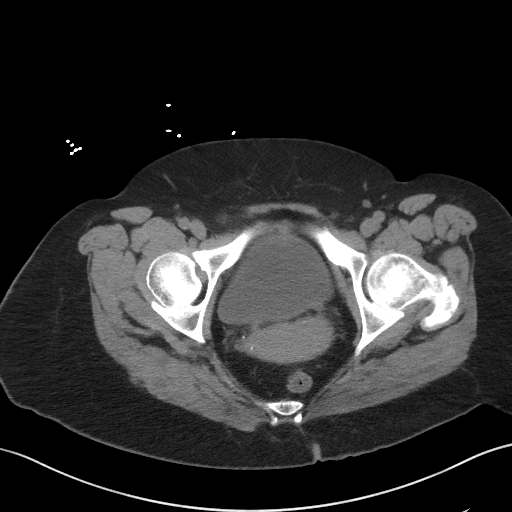
[im 23/88  soft-tissue]
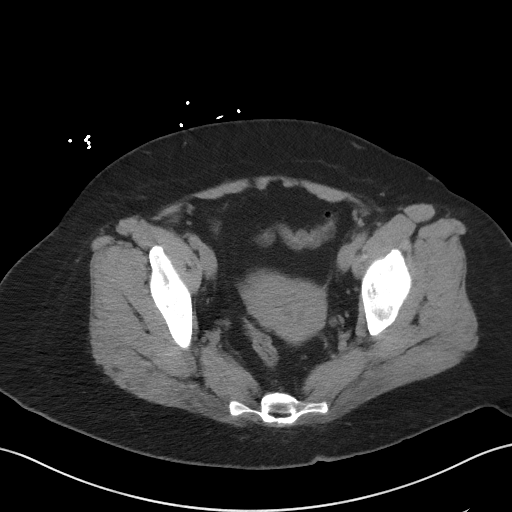
[im 31/88  soft-tissue]
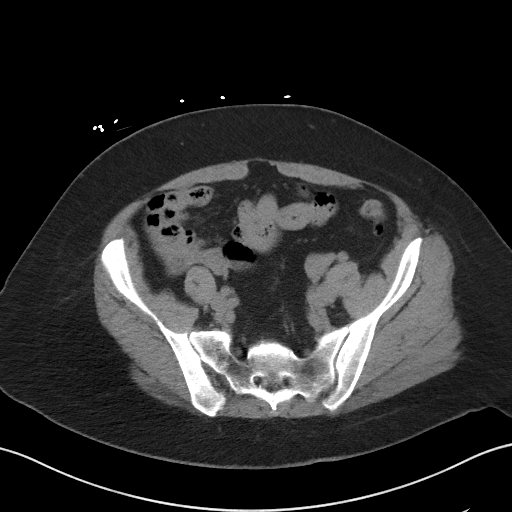
[im 38/88  soft-tissue]
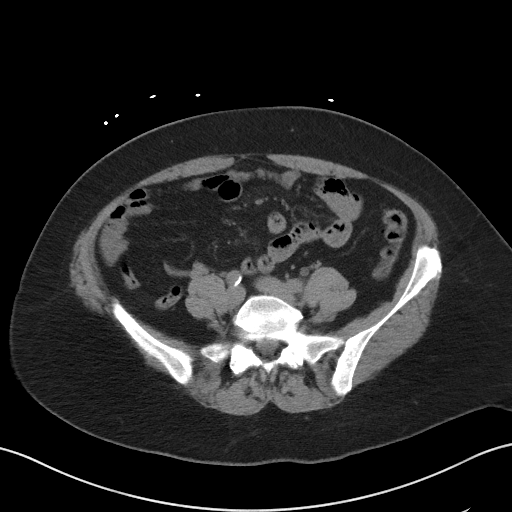
[im 46/88  soft-tissue]
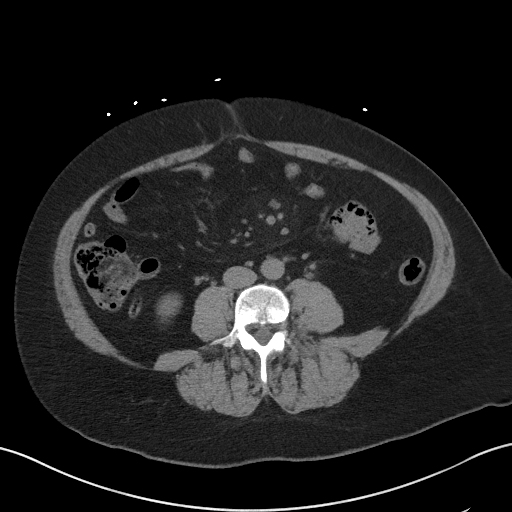
[im 53/88  soft-tissue]
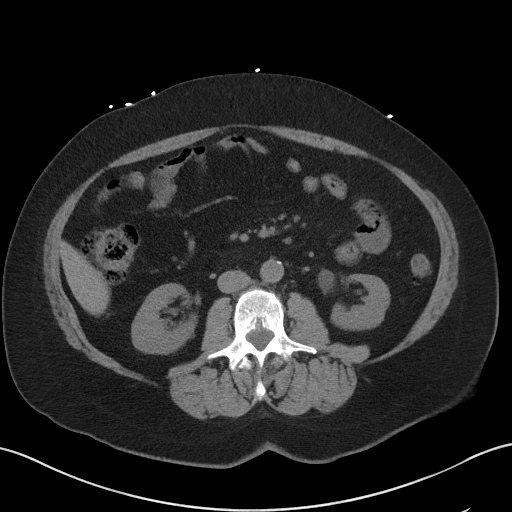
[im 61/88  soft-tissue]
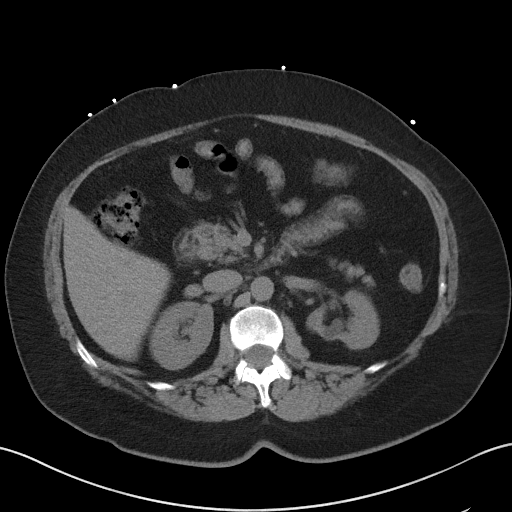
[im 69/88  soft-tissue]
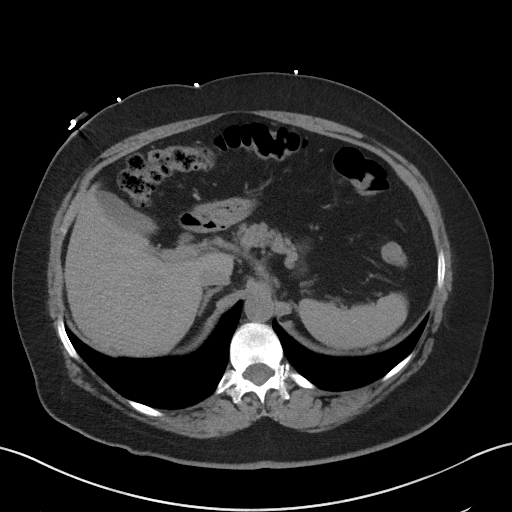
[im 69/88  bone]
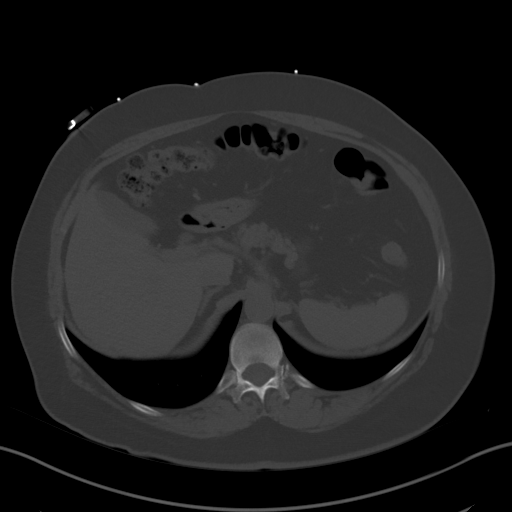
[im 76/88  soft-tissue]
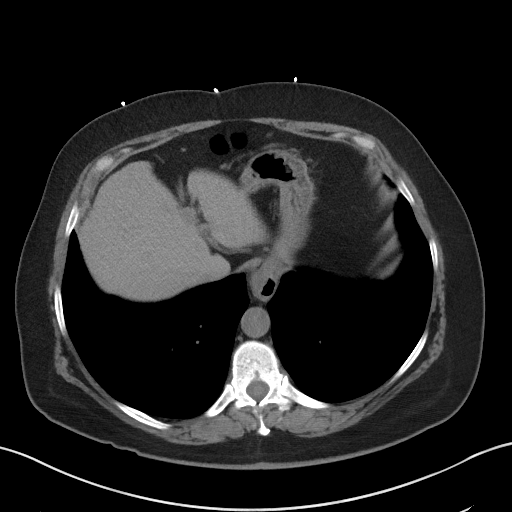
[im 84/88  soft-tissue]
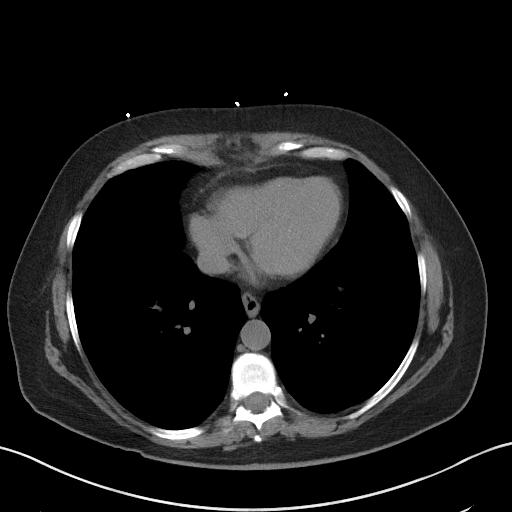

[Series 5: coronal · coronal · 0.66mm/px · 3 of 149 slices shown]
[im 50/149  soft-tissue]
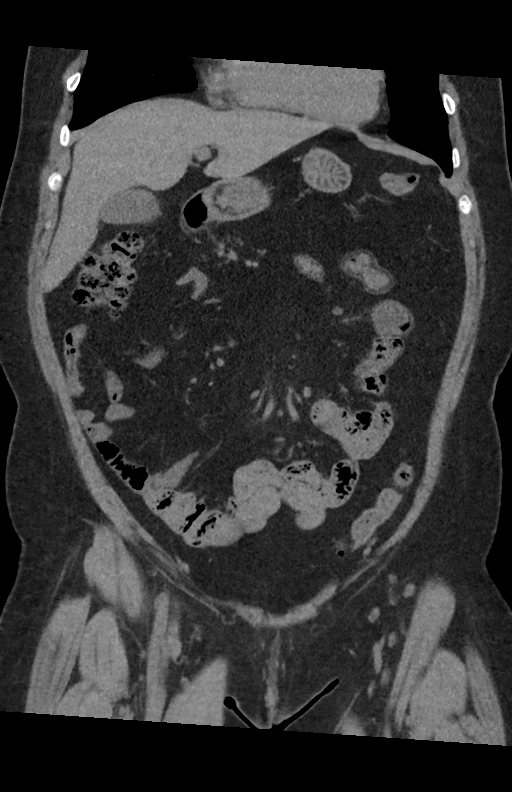
[im 66/149  soft-tissue]
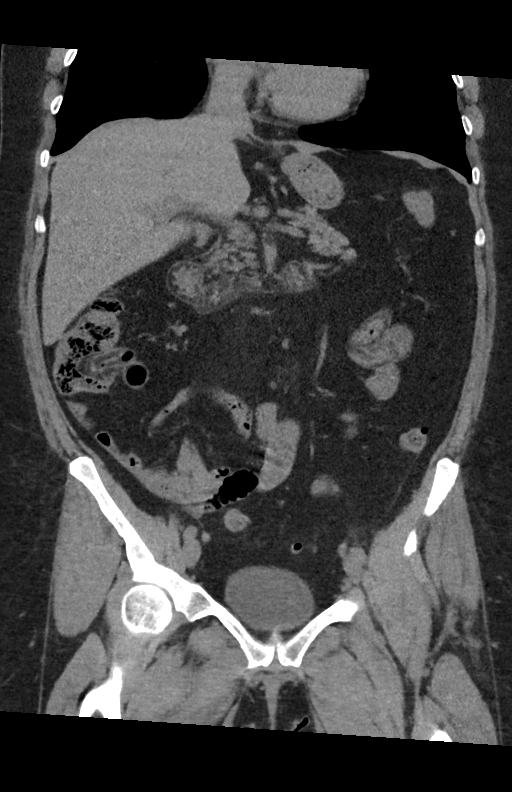
[im 83/149  soft-tissue]
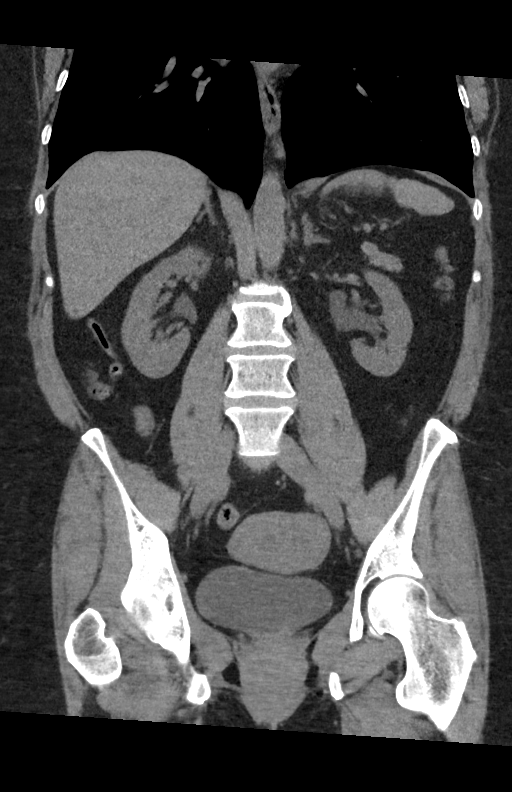

[14 of 46 positions shown; findings below may reference images not displayed]

FINDINGS: Lower chest: Tiny hiatal hernia.  Otherwise unremarkable.

Hepatobiliary: No focal abnormality in the liver on this study
without intravenous contrast. There is no evidence for gallstones,
gallbladder wall thickening, or pericholecystic fluid. No
intrahepatic or extrahepatic biliary dilation.

Pancreas: No focal mass lesion. No dilatation of the main duct. No
intraparenchymal cyst. No peripancreatic edema.

Spleen: No splenomegaly. No focal mass lesion.

Adrenals/Urinary Tract: No adrenal nodule or mass.

No stones are seen in the right kidney. 1.7 cm low-density lesion in
the lower pole the right kidney measures water attenuation and is
compatible with a cyst. Right ureter is unremarkable.

Cortical scarring noted in the upper pole of the left kidney. There
is mild fullness of the left intrarenal collecting system and ureter
down to the level of the UVJ.

4 mm stone is identified in the bladder, in close proximity to the
right UVJ. This stone appears to be just cranial and lateral to the
right UVJ and may be free within the urinary bladder.

Stomach/Bowel: Tiny hiatal hernia. Stomach otherwise unremarkable.
Duodenum is normally positioned as is the ligament of Treitz. No
small bowel wall thickening. No small bowel dilatation. The terminal
ileum is normal. The appendix is normal. Diverticuli are seen
scattered along the entire length of the colon without CT findings
of diverticulitis.

Vascular/Lymphatic: There is abdominal aortic atherosclerosis
without aneurysm. There is no gastrohepatic or hepatoduodenal
ligament lymphadenopathy. No intraperitoneal or retroperitoneal
lymphadenopathy. No pelvic sidewall lymphadenopathy.

Reproductive: Uterine morphology suggests septate or bicornuate
uterus. There is no adnexal mass.

Other: No intraperitoneal free fluid.

Musculoskeletal: No worrisome lytic or sclerotic osseous
abnormality.
IMPRESSION: 4 mm stone is identified in the posterior bladder, in close
proximity to the right UVJ. Given the history of left-sided symptoms
and fullness in the left intrarenal collecting system and ureter,
this probably represents a left renal stone that has passed into the
bladder lumen and has migrated to the posterior right bladder lumen
which is more dependent than the left on this scan.. Careful review
of sagittal and coronal imaging suggests that the stone is just
cranial to and lateral to the right UVJ.

No other urinary stone disease evident. The large left renal stone
seen in the upper pole on the prior study is no longer evident.

Tiny hiatal hernia.

## 2019-11-06 ENCOUNTER — Other Ambulatory Visit: Payer: Self-pay | Admitting: Internal Medicine

## 2019-11-06 DIAGNOSIS — Z1231 Encounter for screening mammogram for malignant neoplasm of breast: Secondary | ICD-10-CM

## 2019-12-16 ENCOUNTER — Other Ambulatory Visit: Payer: Self-pay | Admitting: Family

## 2019-12-16 DIAGNOSIS — R102 Pelvic and perineal pain: Secondary | ICD-10-CM

## 2019-12-24 ENCOUNTER — Other Ambulatory Visit: Payer: Self-pay

## 2019-12-24 ENCOUNTER — Ambulatory Visit
Admission: RE | Admit: 2019-12-24 | Discharge: 2019-12-24 | Disposition: A | Discharge status: Home / Self Care | Payer: Medicare Other | Source: Ambulatory Visit | Attending: Family | Admitting: Family

## 2019-12-24 DIAGNOSIS — R102 Pelvic and perineal pain: Secondary | ICD-10-CM | POA: Diagnosis present

## 2020-01-15 LAB — HM PAP SMEAR

## 2020-12-02 DIAGNOSIS — R7301 Impaired fasting glucose: Secondary | ICD-10-CM | POA: Diagnosis not present

## 2020-12-02 DIAGNOSIS — I10 Essential (primary) hypertension: Secondary | ICD-10-CM | POA: Diagnosis not present

## 2020-12-02 DIAGNOSIS — E782 Mixed hyperlipidemia: Secondary | ICD-10-CM | POA: Diagnosis not present

## 2020-12-02 DIAGNOSIS — R7303 Prediabetes: Secondary | ICD-10-CM | POA: Diagnosis not present

## 2020-12-02 DIAGNOSIS — G629 Polyneuropathy, unspecified: Secondary | ICD-10-CM | POA: Diagnosis not present

## 2020-12-02 DIAGNOSIS — D519 Vitamin B12 deficiency anemia, unspecified: Secondary | ICD-10-CM | POA: Diagnosis not present

## 2020-12-02 DIAGNOSIS — R300 Dysuria: Secondary | ICD-10-CM | POA: Diagnosis not present

## 2020-12-02 DIAGNOSIS — E559 Vitamin D deficiency, unspecified: Secondary | ICD-10-CM | POA: Diagnosis not present

## 2021-02-11 DIAGNOSIS — Z79899 Other long term (current) drug therapy: Secondary | ICD-10-CM | POA: Diagnosis not present

## 2021-02-11 DIAGNOSIS — Z1389 Encounter for screening for other disorder: Secondary | ICD-10-CM | POA: Diagnosis not present

## 2021-03-17 DIAGNOSIS — R7303 Prediabetes: Secondary | ICD-10-CM | POA: Diagnosis not present

## 2021-03-17 DIAGNOSIS — E782 Mixed hyperlipidemia: Secondary | ICD-10-CM | POA: Diagnosis not present

## 2021-03-17 DIAGNOSIS — E039 Hypothyroidism, unspecified: Secondary | ICD-10-CM | POA: Diagnosis not present

## 2021-03-17 DIAGNOSIS — E559 Vitamin D deficiency, unspecified: Secondary | ICD-10-CM | POA: Diagnosis not present

## 2021-03-17 DIAGNOSIS — I10 Essential (primary) hypertension: Secondary | ICD-10-CM | POA: Diagnosis not present

## 2021-04-09 IMAGING — US US PELVIS COMPLETE WITH TRANSVAGINAL
1 series · 13 of 25 positions shown · non-contrast
Comparison: None

CLINICAL DATA: Pelvic pain, postmenopausal

EXAM:
TRANSABDOMINAL AND TRANSVAGINAL ULTRASOUND OF PELVIS
TECHNIQUE: Both transabdominal and transvaginal ultrasound examinations of the
pelvis were performed. Transabdominal technique was performed for
global imaging of the pelvis including uterus, ovaries, adnexal
regions, and pelvic cul-de-sac. It was necessary to proceed with
endovaginal exam following the transabdominal exam to visualize the
endometrium and ovaries.

[Series 1: us pelvic complete with transvaginal · 13 of 40 slices shown]
[im 1/40]
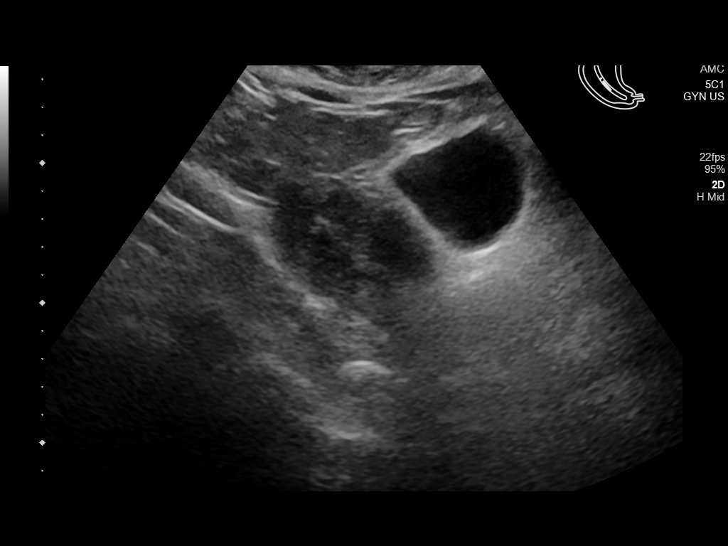
[im 4/40]
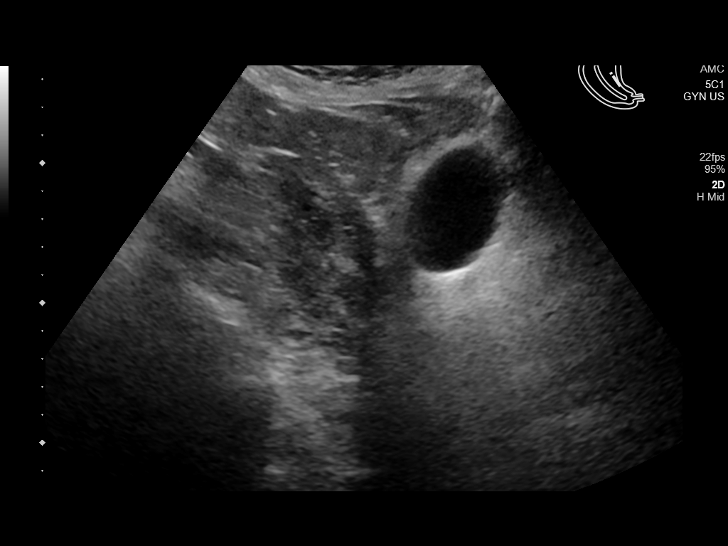
[im 7/40]
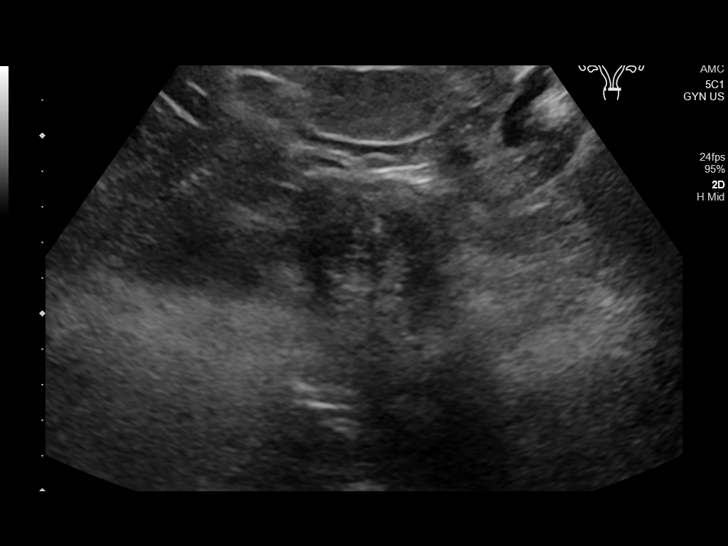
[im 10/40]
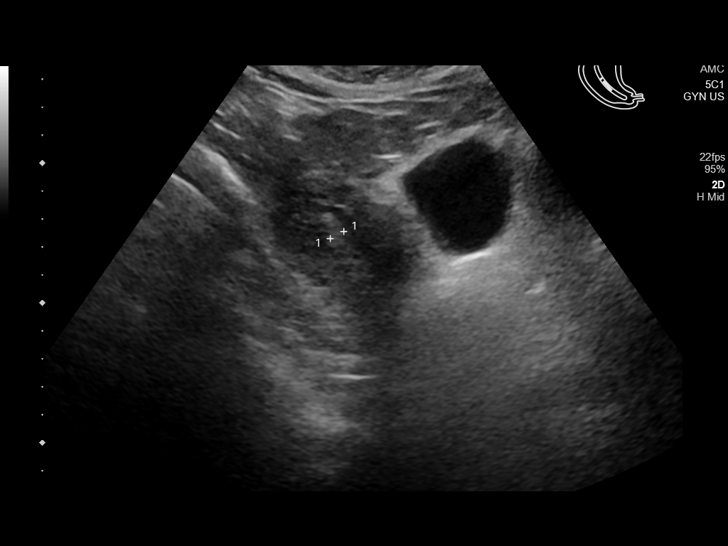
[im 14/40]
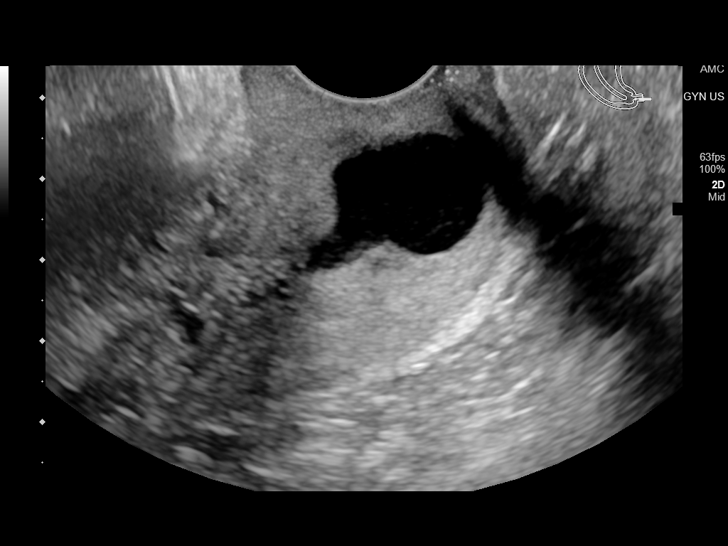
[im 17/40]
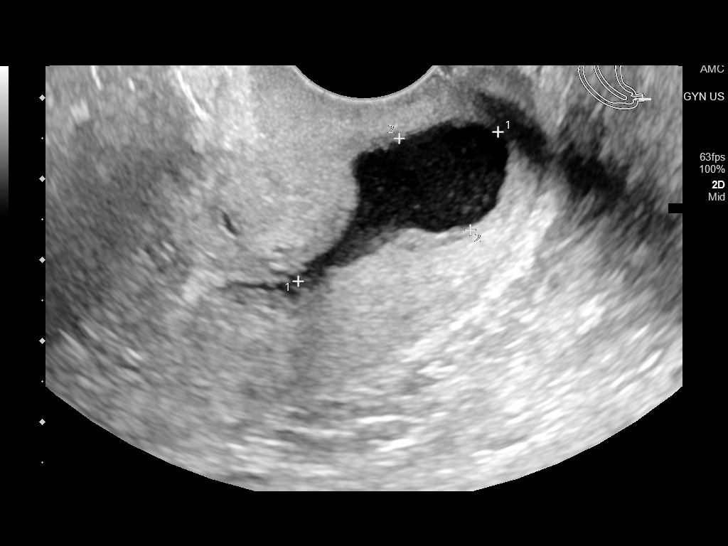
[im 20/40]
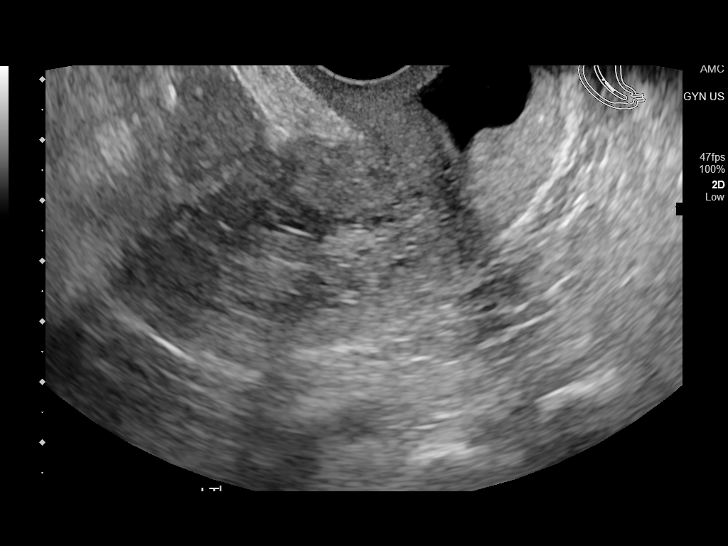
[im 23/40]
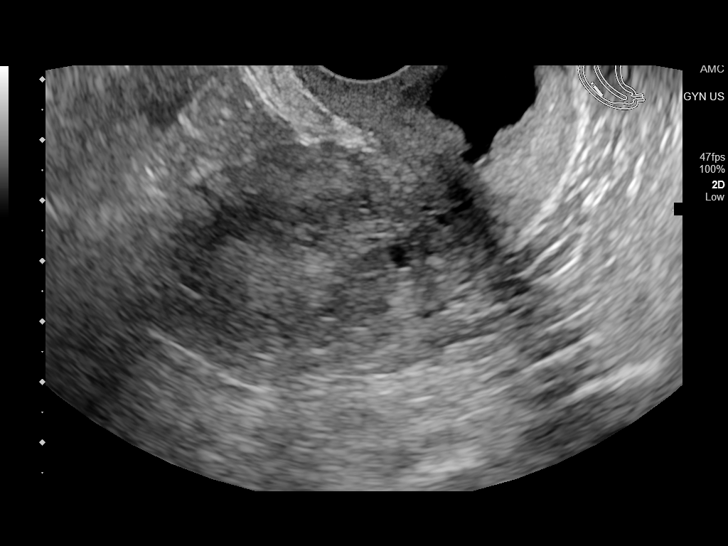
[im 27/40]
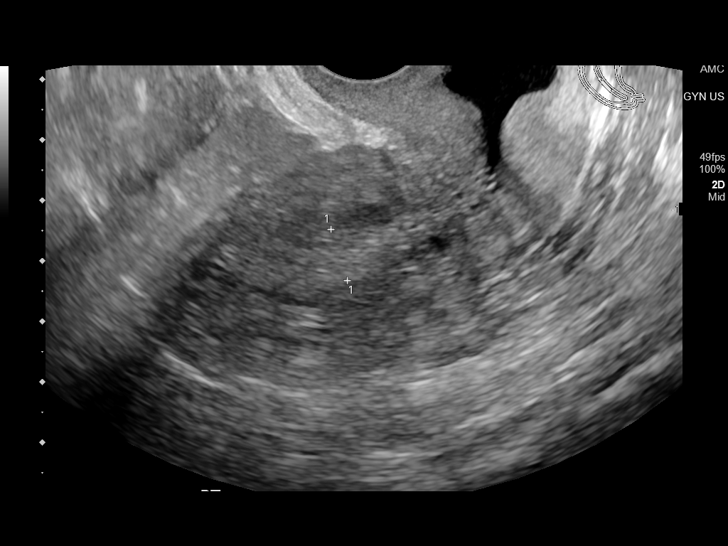
[im 30/40]
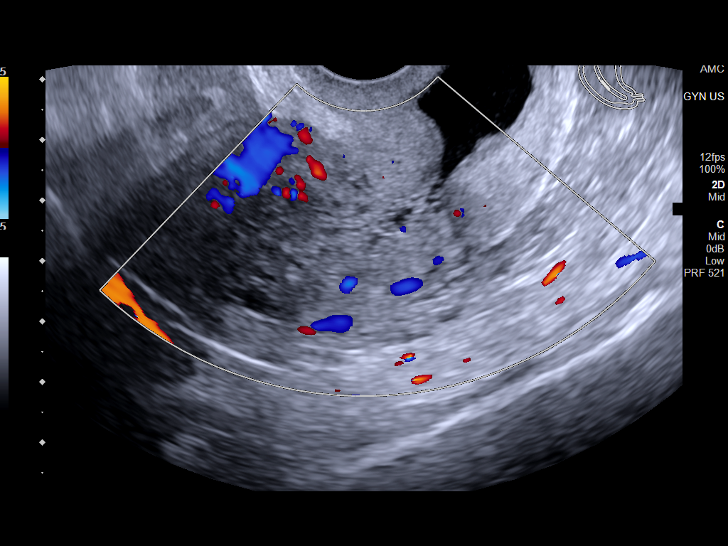
[im 33/40]
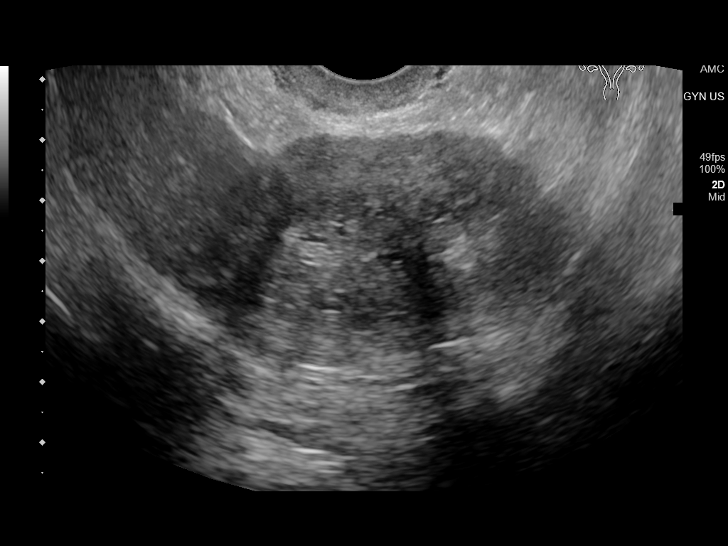
[im 36/40]
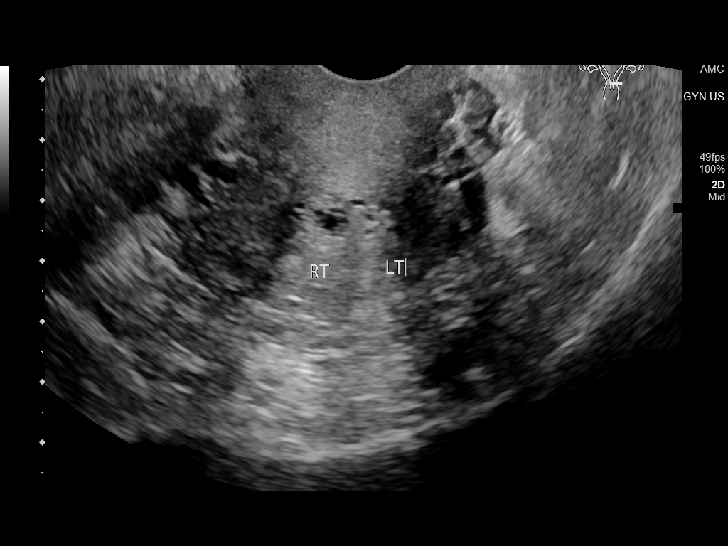
[im 40/40]
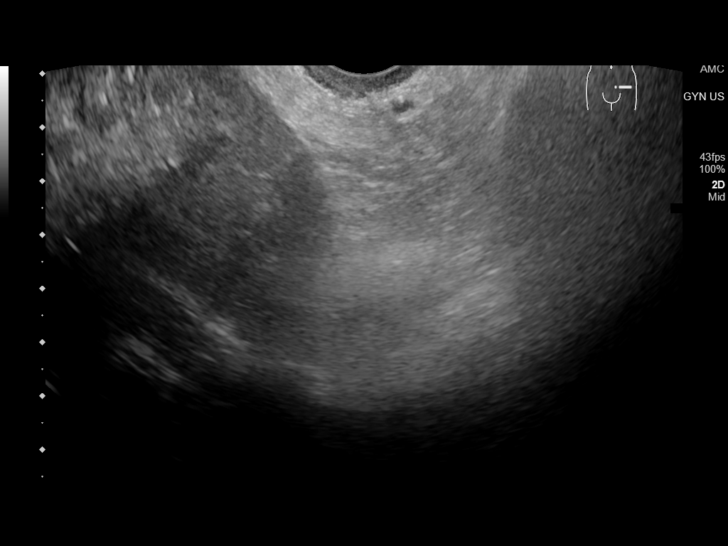

[13 of 25 positions shown; findings below may reference images not displayed]

FINDINGS: Uterus

Measurements: 8.3 x 3.9 x 4.8 cm = volume: 81 ML. Anteverted.
Question septate versus bicornuate morphology. Heterogeneous
myometrium. No definite uterine mass.

Endometrium

Thickness: 9 mm RIGHT, 7 mm LEFT. No endometrial fluid. Complex
fluid collection with debris identified within the endocervical
canal 3.1 x 1.4 cm on longitudinal images, no adequate transverse
measurement obtained

Right ovary

Not visualized, likely obscured by bowel

Left ovary

Not visualized, likely obscured by bowel

Other findings

No free pelvic fluid.
IMPRESSION: Nonvisualization of ovaries.

Abnormal fluid collection within endometrial canal measuring at
least 3.1 x 1.4 cm in size, third dimension not adequately measured,
collection containing debris.

Septate versus bicornuate uterine morphology with thickening of the
endometrial complex at the RIGHT limb 9 mm diameter; endometrial
thickness is considered abnormal for an asymptomatic post-menopausal
female. Endometrial sampling should be considered to exclude
carcinoma.

## 2021-06-24 DIAGNOSIS — R7303 Prediabetes: Secondary | ICD-10-CM | POA: Diagnosis not present

## 2021-06-24 DIAGNOSIS — E039 Hypothyroidism, unspecified: Secondary | ICD-10-CM | POA: Diagnosis not present

## 2021-06-24 DIAGNOSIS — G43009 Migraine without aura, not intractable, without status migrainosus: Secondary | ICD-10-CM | POA: Diagnosis not present

## 2021-06-24 DIAGNOSIS — E782 Mixed hyperlipidemia: Secondary | ICD-10-CM | POA: Diagnosis not present

## 2021-06-24 DIAGNOSIS — I10 Essential (primary) hypertension: Secondary | ICD-10-CM | POA: Diagnosis not present

## 2021-09-27 DIAGNOSIS — D519 Vitamin B12 deficiency anemia, unspecified: Secondary | ICD-10-CM | POA: Diagnosis not present

## 2021-09-27 DIAGNOSIS — E559 Vitamin D deficiency, unspecified: Secondary | ICD-10-CM | POA: Diagnosis not present

## 2021-09-27 DIAGNOSIS — I10 Essential (primary) hypertension: Secondary | ICD-10-CM | POA: Diagnosis not present

## 2021-09-27 DIAGNOSIS — E782 Mixed hyperlipidemia: Secondary | ICD-10-CM | POA: Diagnosis not present

## 2021-09-27 DIAGNOSIS — R7303 Prediabetes: Secondary | ICD-10-CM | POA: Diagnosis not present

## 2022-01-10 DIAGNOSIS — E782 Mixed hyperlipidemia: Secondary | ICD-10-CM | POA: Diagnosis not present

## 2022-01-10 DIAGNOSIS — N390 Urinary tract infection, site not specified: Secondary | ICD-10-CM | POA: Diagnosis not present

## 2022-01-10 DIAGNOSIS — G4733 Obstructive sleep apnea (adult) (pediatric): Secondary | ICD-10-CM | POA: Diagnosis not present

## 2022-01-10 DIAGNOSIS — R300 Dysuria: Secondary | ICD-10-CM | POA: Diagnosis not present

## 2022-01-10 DIAGNOSIS — R7303 Prediabetes: Secondary | ICD-10-CM | POA: Diagnosis not present

## 2022-01-10 DIAGNOSIS — I10 Essential (primary) hypertension: Secondary | ICD-10-CM | POA: Diagnosis not present

## 2022-01-10 DIAGNOSIS — D519 Vitamin B12 deficiency anemia, unspecified: Secondary | ICD-10-CM | POA: Diagnosis not present

## 2022-01-10 DIAGNOSIS — E559 Vitamin D deficiency, unspecified: Secondary | ICD-10-CM | POA: Diagnosis not present

## 2022-01-10 DIAGNOSIS — E039 Hypothyroidism, unspecified: Secondary | ICD-10-CM | POA: Diagnosis not present

## 2022-03-14 DIAGNOSIS — Z79899 Other long term (current) drug therapy: Secondary | ICD-10-CM | POA: Diagnosis not present

## 2022-03-14 DIAGNOSIS — Z1389 Encounter for screening for other disorder: Secondary | ICD-10-CM | POA: Diagnosis not present

## 2022-04-22 ENCOUNTER — Other Ambulatory Visit: Payer: Self-pay

## 2022-04-22 ENCOUNTER — Emergency Department
Admission: EM | Admit: 2022-04-22 | Discharge: 2022-04-22 | Disposition: A | Discharge status: Home / Self Care | Payer: Medicare Other | Attending: Emergency Medicine | Admitting: Emergency Medicine

## 2022-04-22 ENCOUNTER — Emergency Department: Payer: Medicare Other

## 2022-04-22 DIAGNOSIS — I10 Essential (primary) hypertension: Secondary | ICD-10-CM | POA: Insufficient documentation

## 2022-04-22 DIAGNOSIS — N3001 Acute cystitis with hematuria: Secondary | ICD-10-CM | POA: Insufficient documentation

## 2022-04-22 DIAGNOSIS — I700 Atherosclerosis of aorta: Secondary | ICD-10-CM | POA: Diagnosis not present

## 2022-04-22 DIAGNOSIS — R1032 Left lower quadrant pain: Secondary | ICD-10-CM | POA: Diagnosis present

## 2022-04-22 DIAGNOSIS — K449 Diaphragmatic hernia without obstruction or gangrene: Secondary | ICD-10-CM | POA: Diagnosis not present

## 2022-04-22 DIAGNOSIS — R109 Unspecified abdominal pain: Secondary | ICD-10-CM | POA: Diagnosis not present

## 2022-04-22 DIAGNOSIS — N3289 Other specified disorders of bladder: Secondary | ICD-10-CM | POA: Diagnosis not present

## 2022-04-22 LAB — COMPREHENSIVE METABOLIC PANEL
ALT: 14 U/L (ref 0–44)
AST: 19 U/L (ref 15–41)
Albumin: 3.7 g/dL (ref 3.5–5.0)
Alkaline Phosphatase: 55 U/L (ref 38–126)
Anion gap: 7 (ref 5–15)
BUN: 16 mg/dL (ref 6–20)
CO2: 26 mmol/L (ref 22–32)
Calcium: 8.7 mg/dL — ABNORMAL LOW (ref 8.9–10.3)
Chloride: 102 mmol/L (ref 98–111)
Creatinine, Ser: 0.91 mg/dL (ref 0.44–1.00)
GFR, Estimated: >60 mL/min (ref >60)
Glucose, Bld: 94 mg/dL (ref 70–99)
Potassium: 3.8 mmol/L (ref 3.5–5.1)
Sodium: 135 mmol/L (ref 135–145)
Total Bilirubin: 0.7 mg/dL (ref 0.3–1.2)
Total Protein: 6.7 g/dL (ref 6.5–8.1)

## 2022-04-22 LAB — URINALYSIS, ROUTINE W REFLEX MICROSCOPIC
Bacteria, UA: NONE SEEN
Bilirubin Urine: NEGATIVE
Glucose, UA: NEGATIVE mg/dL
Ketones, ur: NEGATIVE mg/dL
Nitrite: NEGATIVE
Protein, ur: NEGATIVE mg/dL
Specific Gravity, Urine: 1.001 — ABNORMAL LOW (ref 1.005–1.030)
Squamous Epithelial / LPF: NONE SEEN (ref 0–5)
pH: 6 (ref 5.0–8.0)

## 2022-04-22 LAB — CBC
HCT: 38 % (ref 36.0–46.0)
Hemoglobin: 12.3 g/dL (ref 12.0–15.0)
MCH: 30 pg (ref 26.0–34.0)
MCHC: 32.4 g/dL (ref 30.0–36.0)
MCV: 92.7 fL (ref 80.0–100.0)
Platelets: 270 10*3/uL (ref 150–400)
RBC: 4.1 MIL/uL (ref 3.87–5.11)
RDW: 12.5 % (ref 11.5–15.5)
WBC: 13.2 10*3/uL — ABNORMAL HIGH (ref 4.0–10.5)
nRBC: 0 % (ref 0.0–0.2)

## 2022-04-22 LAB — LIPASE, BLOOD: Lipase: 58 U/L — ABNORMAL HIGH (ref 11–51)

## 2022-04-22 MED ORDER — CEPHALEXIN 500 MG PO CAPS: 500.0000 mg | ORAL_CAPSULE | Freq: Four times a day (QID) | ORAL | 0 refills | Status: AC

## 2022-04-22 MED ORDER — SODIUM CHLORIDE 0.9 % IV SOLN
1.0000 g | Freq: Once | INTRAVENOUS | Status: AC
  Administered 2022-04-22: 1 g via INTRAVENOUS
  Filled 2022-04-22: qty 10

## 2022-04-22 NOTE — ED Triage Notes (Signed)
Pt to ED via POV from home. Pt reports lower back pain, lower abdominal pain, burning with urination, cloudy/strong in urine. Pt reports hx UTI and kidney stones.

## 2022-04-22 NOTE — ED Notes (Signed)
Pt verbalized understanding of discharge instructions.

## 2022-04-22 NOTE — Discharge Instructions (Addendum)
Take Keflex four times daily for the next seven days.  

## 2022-04-22 NOTE — ED Provider Notes (Signed)
[facility] Provider Note  Patient Contact: 5:50 PM (approximate)   History   Back Pain and Abdominal Pain   HPI  Isabel Welch is a 61 y.o. female with a history of migraines, nephrolithiasis and hypertension, presents to the emergency department with left lower quadrant abdominal pain and dysuria as well as hematuria that started last night.  Patient states that she has increased hydration and took Azo and has some positional relief but not complete resolution of her symptoms.  She had some nausea but no vomiting.  No fever.      Physical Exam   Triage Vital Signs: ED Triage Vitals  Enc Vitals Group     BP 04/22/22 1501 138/84     Pulse Rate 04/22/22 1501 77     Resp 04/22/22 1501 18     Temp --      Temp src --      SpO2 04/22/22 1501 96 %     Weight 04/22/22 1502 160 lb (72.6 kg)     Height 04/22/22 1502 5\' 3"  (1.6 m)     Head Circumference --      Peak Flow --      Pain Score 04/22/22 1502 6     Pain Loc --      Pain Edu? --      Excl. in [place]? --     Most recent vital signs: Vitals:   04/22/22 1501 04/22/22 1818  BP: 138/84 120/78  Pulse: 77 66  Resp: 18 18  Temp:  97.7 F (36.5 C)  SpO2: 96% 99%     General: Alert and in no acute distress. Eyes:  PERRL. EOMI. Head: No acute traumatic findings ENT:      Nose: No congestion/rhinnorhea.      Mouth/Throat: Mucous membranes are moist. Neck: No stridor. No cervical spine tenderness to palpation. Cardiovascular:  Good peripheral perfusion Respiratory: Normal respiratory effort without tachypnea or retractions. Lungs CTAB. Good air entry to the bases with no decreased or absent breath sounds. Gastrointestinal: Bowel sounds 4 quadrants. Soft and tender to palpation. No guarding or rigidity. No palpable masses. No distention. No CVA tenderness. Musculoskeletal: Full range of motion to all extremities.  Neurologic:  No gross focal neurologic deficits are appreciated.  Skin:    No rash noted Other:   ED Results / Procedures / Treatments   Labs (all labs ordered are listed, but only abnormal results are displayed) Labs Reviewed  LIPASE, BLOOD - Abnormal; Notable for the following components:      Result Value   Lipase 58 (*)    All other components within normal limits  COMPREHENSIVE METABOLIC PANEL - Abnormal; Notable for the following components:   Calcium 8.7 (*)    All other components within normal limits  CBC - Abnormal; Notable for the following components:   WBC 13.2 (*)    All other components within normal limits  URINALYSIS, ROUTINE W REFLEX MICROSCOPIC - Abnormal; Notable for the following components:   Color, Urine COLORLESS (*)    APPearance CLEAR (*)    Specific Gravity, Urine 1.001 (*)    Hgb urine dipstick MODERATE (*)    Leukocytes,Ua TRACE (*)    All other components within normal limits  URINE CULTURE      RADIOLOGY  I personally viewed and evaluated these images as part of my medical decision making, as well as reviewing the written report by the radiologist.  ED Provider Interpretation: No evidence of  UTI or other obstructive uropathy.   PROCEDURES:  Critical Care performed: No  Procedures   MEDICATIONS ORDERED IN ED: Medications  cefTRIAXone (ROCEPHIN) 1 g in sodium chloride 0.9 % 100 mL IVPB (1 g Intravenous New Bag/Given 04/22/22 1849)     IMPRESSION / MDM / ASSESSMENT AND PLAN / ED COURSE  I reviewed the triage vital signs and the nursing notes.                              Assessment and plan:  Flank pain:   61 year old female with history of nephrolithiasis presents to the emergency department with some left lower quadrant abdominal discomfort as well as dysuria and hematuria noted at home.  Vital signs were reassuring at triage.  On exam, patient did have some left lower quadrant tenderness without guarding.  Differential diagnosis originally included UTI, pyelonephritis, nephrolithiasis.  Overall, I  am less suspicious for nephrolithiasis as patient seems very comfortable without any pain medication administered in the emergency department.  Her urinalysis was not overly concerning for infection and only indicated moderate amount of hemoglobin with trace leukocytes.  CT renal stone study was obtained which was reassuring without acute abnormality.  I did culture patient's urinalysis.  Given history of prior urinary tract infections, I will treat empirically for urinary tract infection.  Patient was given Rocephin in the emergency department and discharged with Keflex after she denied allergy to penicillin.  Return precautions were given to return with new or worsening symptoms.  All patient questions were answered.   FINAL CLINICAL IMPRESSION(S) / ED DIAGNOSES   Final diagnoses:  Acute cystitis with hematuria     Rx / DC Orders   ED Discharge Orders          Ordered    cephALEXin (KEFLEX) 500 MG capsule  4 times daily        04/22/22 1941             Note:  This document was prepared using Dragon voice recognition software and may include unintentional dictation errors.   Pia Mau [place], Cordelia Poche 04/22/22 Wendall Papa, MD 04/23/22 2348

## 2022-04-24 LAB — URINE CULTURE

## 2022-04-27 ENCOUNTER — Telehealth: Payer: Self-pay

## 2022-04-27 NOTE — Telephone Encounter (Signed)
        Patient  visited [place] on 10/7   Telephone encounter attempt : 1st   A HIPAA compliant voice message was left requesting a return call.  Instructed patient to call back    [place], [place] [employer]  [phone] [address], [place], [place] [address] Phone: [phone] Email: [email or web address]

## 2022-08-30 ENCOUNTER — Ambulatory Visit (INDEPENDENT_AMBULATORY_CARE_PROVIDER_SITE_OTHER): Payer: 59 | Admitting: Family

## 2022-08-30 ENCOUNTER — Encounter: Payer: Self-pay | Admitting: Family

## 2022-08-30 VITALS — BP 132/90 | HR 80 | Ht 63.0 in | Wt 146.6 lb

## 2022-08-30 DIAGNOSIS — I10 Essential (primary) hypertension: Secondary | ICD-10-CM | POA: Diagnosis not present

## 2022-08-30 DIAGNOSIS — E559 Vitamin D deficiency, unspecified: Secondary | ICD-10-CM | POA: Insufficient documentation

## 2022-08-30 DIAGNOSIS — E538 Deficiency of other specified B group vitamins: Secondary | ICD-10-CM | POA: Insufficient documentation

## 2022-08-30 DIAGNOSIS — E782 Mixed hyperlipidemia: Secondary | ICD-10-CM | POA: Diagnosis not present

## 2022-08-30 DIAGNOSIS — F3160 Bipolar disorder, current episode mixed, unspecified: Secondary | ICD-10-CM

## 2022-08-30 DIAGNOSIS — R7303 Prediabetes: Secondary | ICD-10-CM

## 2022-08-30 DIAGNOSIS — Z6826 Body mass index (BMI) 26.0-26.9, adult: Secondary | ICD-10-CM

## 2022-08-30 MED ORDER — ATORVASTATIN CALCIUM 10 MG PO TABS: 10.0000 mg | ORAL_TABLET | Freq: Every day | ORAL | 1 refills | Status: DC

## 2022-08-30 MED ORDER — AMLODIPINE BESYLATE 2.5 MG PO TABS: 2.5000 mg | ORAL_TABLET | Freq: Every day | ORAL | 1 refills | Status: DC

## 2022-08-30 NOTE — Progress Notes (Signed)
Established Patient Office Visit  Subjective:  Patient ID: Isabel Welch, female    DOB: 20-Mar-1961  Age: 62 y.o. MRN: [id number]  Chief Complaint  Patient presents with   Follow-up    4 month follow up    Patient is here for her 4 month f/u.   She has been feeling well since her last appointment.    Due for labs No other concerns at this time     Health Maintenance reviewed - patient asked to schedule her mammogram       Past Medical History:  Diagnosis Date   Hypertension    Kidney stone     Social History   Socioeconomic History   Marital status: Single    Spouse name: Not on file   Number of children: Not on file   Years of education: Not on file   Highest education level: Not on file  Occupational History   Not on file  Tobacco Use   Smoking status: Never   Smokeless tobacco: Never  Vaping Use   Vaping Use: Never used  Substance and Sexual Activity   Alcohol use: No   Drug use: Not on file   Sexual activity: Not on file  Other Topics Concern   Not on file  Social History Narrative   Not on file   Social Determinants of Health   Financial Resource Strain: Not on file  Food Insecurity: Not on file  Transportation Needs: Not on file  Physical Activity: Not on file  Stress: Not on file  Social Connections: Not on file  Intimate Partner Violence: Not on file    Family History  Problem Relation Age of Onset   Cancer Mother    Heart failure Father     No Known Allergies  ROS     Objective:   BP (!) 132/90   Pulse 80   Ht 5' 3"$  (1.6 m)   Wt 146 lb 9.6 oz (66.5 kg)   SpO2 96%   BMI 25.97 kg/m   Vitals:   08/30/22 0933  BP: (!) 132/90  Pulse: 80  Height: 5' 3"$  (1.6 m)  Weight: 146 lb 9.6 oz (66.5 kg)  SpO2: 96%  BMI (Calculated): 25.98    Physical Exam   Results for orders placed or performed in visit on 08/30/22  HM PAP SMEAR  Result Value Ref Range   HM Pap smear LSIL   HM COLONOSCOPY  Result Value Ref  Range   HM Colonoscopy See Report (in chart) See Report (in chart), Patient Reported    No results found for this or any previous visit (from the past 2160 hour(s)).    Assessment & Plan:   Problem List Items Addressed This Visit     Bipolar affective disorder, mixed ([place])   Mixed hyperlipidemia - Primary    Labs today - will send mychart message with results Patient has been advised that it may be necessary to restart her cholesterol medication.  She has also been advised on dietary and lifestyle modifications to help control her cholesterol.        Relevant Medications   amLODipine (NORVASC) 2.5 MG tablet   atorvastatin (LIPITOR) 10 MG tablet   Other Relevant Orders   Lipid panel   Prediabetes    Checking A1C today.  Discussed need for strict dietary control of carbohydrate intake as well as increasing activity.        Relevant Orders   Hemoglobin A1c   Essential hypertension,  benign    Labs today - will send Refill for restarting amlodipine.  Discussed need for dietary modifications to control blood pressure and pre-diabetes.         Relevant Medications   amLODipine (NORVASC) 2.5 MG tablet   atorvastatin (LIPITOR) 10 MG tablet   Other Relevant Orders   CBC With Differential   CMP14+EGFR   Vitamin D deficiency, unspecified    Labs today - will send mychart message with results Will supplement as indicated based on results.       Relevant Orders   VITAMIN D 25 Hydroxy (Vit-D Deficiency, Fractures)   B12 deficiency due to diet    Labs today - will send mychart message with results Will supplement as indicated based on results.       Relevant Orders   Vitamin B12   Other Visit Diagnoses     Body mass index 26.0-26.9, adult           Return in about 4 months (around 12/29/2022).   Total time spent: 30 minutes  Mechele Claude, FNP  08/30/2022

## 2022-08-30 NOTE — Assessment & Plan Note (Signed)
Labs today - will send mychart message with results Patient has been advised that it may be necessary to restart her cholesterol medication.  She has also been advised on dietary and lifestyle modifications to help control her cholesterol.

## 2022-08-30 NOTE — Assessment & Plan Note (Signed)
Checking A1C today.  Discussed need for strict dietary control of carbohydrate intake as well as increasing activity.

## 2022-08-30 NOTE — Assessment & Plan Note (Signed)
Labs today - will send mychart message with results Will supplement as indicated based on results.

## 2022-08-30 NOTE — Assessment & Plan Note (Signed)
Labs today - will send Refill for restarting amlodipine.  Discussed need for dietary modifications to control blood pressure and pre-diabetes.

## 2022-08-31 LAB — CMP14+EGFR
ALT: 15 IU/L (ref 0–32)
AST: 22 IU/L (ref 0–40)
Albumin/Globulin Ratio: 1.9 (ref 1.2–2.2)
Albumin: 4.4 g/dL (ref 3.8–4.9)
Alkaline Phosphatase: 65 IU/L (ref 44–121)
BUN/Creatinine Ratio: 17 (ref 9–23)
BUN: 17 mg/dL (ref 6–24)
Bilirubin Total: 0.4 mg/dL (ref 0.0–1.2)
CO2: 23 mmol/L (ref 20–29)
Calcium: 9.5 mg/dL (ref 8.7–10.2)
Chloride: 102 mmol/L (ref 96–106)
Creatinine, Ser: 0.99 mg/dL (ref 0.57–1.00)
Globulin, Total: 2.3 g/dL (ref 1.5–4.5)
Glucose: 93 mg/dL (ref 70–99)
Potassium: 4.4 mmol/L (ref 3.5–5.2)
Sodium: 139 mmol/L (ref 134–144)
Total Protein: 6.7 g/dL (ref 6.0–8.5)
eGFR: 66 mL/min/{1.73_m2} (ref >59)

## 2022-08-31 LAB — CBC WITH DIFFERENTIAL
Basophils Absolute: 0.1 10*3/uL (ref 0.0–0.2)
Basos: 1 %
EOS (ABSOLUTE): 0.1 10*3/uL (ref 0.0–0.4)
Eos: 2 %
Hematocrit: 38.9 % (ref 34.0–46.6)
Hemoglobin: 13 g/dL (ref 11.1–15.9)
Immature Grans (Abs): 0 10*3/uL (ref 0.0–0.1)
Immature Granulocytes: 0 %
Lymphocytes Absolute: 2.3 10*3/uL (ref 0.7–3.1)
Lymphs: 39 %
MCH: 29.7 pg (ref 26.6–33.0)
MCHC: 33.4 g/dL (ref 31.5–35.7)
MCV: 89 fL (ref 79–97)
Monocytes Absolute: 0.4 10*3/uL (ref 0.1–0.9)
Monocytes: 7 %
Neutrophils Absolute: 3 10*3/uL (ref 1.4–7.0)
Neutrophils: 51 %
RBC: 4.37 x10E6/uL (ref 3.77–5.28)
RDW: 11.6 % — ABNORMAL LOW (ref 11.7–15.4)
WBC: 5.8 10*3/uL (ref 3.4–10.8)

## 2022-08-31 LAB — VITAMIN D 25 HYDROXY (VIT D DEFICIENCY, FRACTURES): Vit D, 25-Hydroxy: 49.3 ng/mL (ref 30.0–100.0)

## 2022-08-31 LAB — LIPID PANEL
Chol/HDL Ratio: 3.3 ratio (ref 0.0–4.4)
Cholesterol, Total: 179 mg/dL (ref 100–199)
HDL: 55 mg/dL (ref >39)
LDL Chol Calc (NIH): 110 mg/dL — ABNORMAL HIGH (ref 0–99)
Triglycerides: 76 mg/dL (ref 0–149)
VLDL Cholesterol Cal: 14 mg/dL (ref 5–40)

## 2022-08-31 LAB — VITAMIN B12: Vitamin B-12: 457 pg/mL (ref 232–1245)

## 2022-08-31 LAB — HEMOGLOBIN A1C
Est. average glucose Bld gHb Est-mCnc: 117 mg/dL
Hgb A1c MFr Bld: 5.7 % — ABNORMAL HIGH (ref 4.8–5.6)

## 2022-09-06 ENCOUNTER — Encounter: Payer: Self-pay | Admitting: Family

## 2022-09-14 DIAGNOSIS — Z79899 Other long term (current) drug therapy: Secondary | ICD-10-CM | POA: Diagnosis not present

## 2022-09-27 ENCOUNTER — Encounter: Payer: Self-pay | Admitting: Family

## 2022-09-27 ENCOUNTER — Ambulatory Visit (INDEPENDENT_AMBULATORY_CARE_PROVIDER_SITE_OTHER): Payer: 59 | Admitting: Family

## 2022-09-27 VITALS — BP 112/62 | HR 87 | Ht 63.0 in | Wt 154.2 lb

## 2022-09-27 DIAGNOSIS — J029 Acute pharyngitis, unspecified: Secondary | ICD-10-CM

## 2022-09-27 LAB — POCT RAPID STREP A (OFFICE): Rapid Strep A Screen: NEGATIVE

## 2022-09-27 MED ORDER — LIDOCAINE VISCOUS HCL 2 % MT SOLN: 15.0000 mL | Freq: Four times a day (QID) | OROMUCOSAL | 0 refills | Status: AC | PRN

## 2022-09-27 NOTE — Progress Notes (Signed)
Established Patient Office Visit  Subjective:  Patient ID: Isabel Welch, female    DOB: [birth year]  Age: 62 y.o. MRN: [id number]  Chief Complaint  Patient presents with   Sore Throat    Sore Throat  This is a new problem. The current episode started in the past 7 days. The problem has been gradually worsening. Neither side of throat is experiencing more pain than the other. There has been no fever. The pain is at a severity of 7/10. The pain is moderate. Associated symptoms include a hoarse voice and trouble swallowing. Pertinent negatives include no congestion. Exposure to: unknown: patient is around younger students frequently.. She has tried cool liquids and NSAIDs for the symptoms. The treatment provided no relief.     Past Medical History:  Diagnosis Date   Hypertension    Kidney stone     Past Surgical History:  Procedure Laterality Date   gallbladder stents     LEAP     TUBAL LIGATION      Social History   Socioeconomic History   Marital status: Single    Spouse name: Not on file   Number of children: Not on file   Years of education: Not on file   Highest education level: Not on file  Occupational History   Not on file  Tobacco Use   Smoking status: Never   Smokeless tobacco: Never  Vaping Use   Vaping Use: Never used  Substance and Sexual Activity   Alcohol use: No   Drug use: Not on file   Sexual activity: Not on file  Other Topics Concern   Not on file  Social History Narrative   Not on file   Social Determinants of Health   Financial Resource Strain: Not on file  Food Insecurity: Not on file  Transportation Needs: Not on file  Physical Activity: Not on file  Stress: Not on file  Social Connections: Not on file  Intimate Partner Violence: Not on file    Family History  Problem Relation Age of Onset   Cancer Mother    Heart failure Father     No Known Allergies  Review of Systems  Constitutional:  Positive for chills.  Negative for fever.  HENT:  Positive for hoarse voice, sore throat and trouble swallowing. Negative for congestion.   All other systems reviewed and are negative.      Objective:   BP 112/62   Pulse 87   Ht '5\' 3"'$  (1.6 m)   Wt 154 lb 3.2 oz (69.9 kg)   SpO2 98%   BMI 27.32 kg/m   Vitals:   09/27/22 1036  BP: 112/62  Pulse: 87  Height: '5\' 3"'$  (1.6 m)  Weight: 154 lb 3.2 oz (69.9 kg)  SpO2: 98%  BMI (Calculated): 27.32    Physical Exam Vitals and nursing note reviewed.  Constitutional:      Appearance: Normal appearance. She is normal weight.  HENT:     Head: Normocephalic.     Right Ear: Tympanic membrane and ear canal normal.     Left Ear: Tympanic membrane and ear canal normal.     Nose: No congestion or rhinorrhea.     Mouth/Throat:     Pharynx: Pharyngeal swelling and posterior oropharyngeal erythema present. No oropharyngeal exudate.     Tonsils: No tonsillar exudate or tonsillar abscesses.  Eyes:     Pupils: Pupils are equal, round, and reactive to light.  Cardiovascular:     Rate and  Rhythm: Normal rate.  Pulmonary:     Effort: Pulmonary effort is normal.  Neurological:     Mental Status: She is alert.      No results found for any visits on 09/27/22.  Recent Results (from the past 2160 hour(s))  Lipid panel     Status: Abnormal   Collection Time: 08/30/22 10:15 AM  Result Value Ref Range   Cholesterol, Total 179 100 - 199 mg/dL   Triglycerides 76 0 - 149 mg/dL   HDL 55 >39 mg/dL   VLDL Cholesterol Cal 14 5 - 40 mg/dL   LDL Chol Calc (NIH) 110 (H) 0 - 99 mg/dL   Chol/HDL Ratio 3.3 0.0 - 4.4 ratio    Comment:                                   T. Chol/HDL Ratio                                             Men  Women                               1/2 Avg.Risk  3.4    3.3                                   Avg.Risk  5.0    4.4                                2X Avg.Risk  9.6    7.1                                3X Avg.Risk 23.4   11.0   VITAMIN D 25  Hydroxy (Vit-D Deficiency, Fractures)     Status: None   Collection Time: 08/30/22 10:15 AM  Result Value Ref Range   Vit D, 25-Hydroxy 49.3 30.0 - 100.0 ng/mL    Comment: Vitamin D deficiency has been defined by the [place] practice guideline as a level of serum 25-OH vitamin D less than 20 ng/mL (1,2). The Endocrine Society went on to further define vitamin D insufficiency as a level between 21 and 29 ng/mL (2). 1. IOM (Institute of Medicine). 2010. Dietary reference    intakes for calcium and D. [place]: The    [employer]. 2. Holick MF, Binkley Panguitch, Bischoff-Ferrari HA, et al.    Evaluation, treatment, and prevention of vitamin D    deficiency: an Endocrine Society clinical practice    guideline. JCEM. 2011 Jul; 96(7):1911-30.   CBC With Differential     Status: Abnormal   Collection Time: 08/30/22 10:15 AM  Result Value Ref Range   WBC 5.8 3.4 - 10.8 x10E3/uL   RBC 4.37 3.77 - 5.28 x10E6/uL   Hemoglobin 13.0 11.1 - 15.9 g/dL   Hematocrit 38.9 34.0 - 46.6 %   MCV 89 79 - 97 fL   MCH 29.7 26.6 - 33.0 pg   MCHC 33.4 31.5 - 35.7 g/dL   RDW 11.6 (  L) 11.7 - 15.4 %   Neutrophils 51 Not Estab. %   Lymphs 39 Not Estab. %   Monocytes 7 Not Estab. %   Eos 2 Not Estab. %   Basos 1 Not Estab. %   Neutrophils Absolute 3.0 1.4 - 7.0 x10E3/uL   Lymphocytes Absolute 2.3 0.7 - 3.1 x10E3/uL   Monocytes Absolute 0.4 0.1 - 0.9 x10E3/uL   EOS (ABSOLUTE) 0.1 0.0 - 0.4 x10E3/uL   Basophils Absolute 0.1 0.0 - 0.2 x10E3/uL   Immature Granulocytes 0 Not Estab. %   Immature Grans (Abs) 0.0 0.0 - 0.1 x10E3/uL  CMP14+EGFR     Status: None   Collection Time: 08/30/22 10:15 AM  Result Value Ref Range   Glucose 93 70 - 99 mg/dL   BUN 17 6 - 24 mg/dL   Creatinine, Ser 0.99 0.57 - 1.00 mg/dL   eGFR 66 >59 mL/min/1.73   BUN/Creatinine Ratio 17 9 - 23   Sodium 139 134 - 144 mmol/L   Potassium 4.4 3.5 - 5.2 mmol/L   Chloride 102 96 - 106  mmol/L   CO2 23 20 - 29 mmol/L   Calcium 9.5 8.7 - 10.2 mg/dL   Total Protein 6.7 6.0 - 8.5 g/dL   Albumin 4.4 3.8 - 4.9 g/dL   Globulin, Total 2.3 1.5 - 4.5 g/dL   Albumin/Globulin Ratio 1.9 1.2 - 2.2   Bilirubin Total 0.4 0.0 - 1.2 mg/dL   Alkaline Phosphatase 65 44 - 121 IU/L   AST 22 0 - 40 IU/L   ALT 15 0 - 32 IU/L  Hemoglobin A1c     Status: Abnormal   Collection Time: 08/30/22 10:15 AM  Result Value Ref Range   Hgb A1c MFr Bld 5.7 (H) 4.8 - 5.6 %    Comment:          Prediabetes: 5.7 - 6.4          Diabetes: >6.4          Glycemic control for adults with diabetes: <7.0    Est. average glucose Bld gHb Est-mCnc 117 mg/dL  Vitamin B12     Status: None   Collection Time: 08/30/22 10:15 AM  Result Value Ref Range   Vitamin B-12 457 232 - 1,245 pg/mL      Assessment & Plan:   Problem List Items Addressed This Visit   None Visit Diagnoses     Pharyngitis, unspecified etiology    -  Primary   Strep test in office today.  Sending NP swab for COVID/Flu/RSV and getting EBV antibody panel.  Sending rx for viscous lidocaine.  Will call with results   Relevant Orders   POCT rapid strep A   COVID-19, Flu A+B and RSV   EPSTEIN-BARR VIRUS (EBV) Antibody Profile- [facility]       Return in about 1 week (around 10/04/2022) for F/U.   Total time spent: 20 minutes  Mechele Claude, FNP  09/27/2022

## 2022-09-29 LAB — COVID-19, FLU A+B AND RSV
Influenza A, NAA: NOT DETECTED
Influenza B, NAA: NOT DETECTED
RSV, NAA: NOT DETECTED
SARS-CoV-2, NAA: NOT DETECTED

## 2022-09-29 LAB — EPSTEIN-BARR VIRUS (EBV) ANTIBODY PROFILE
EBV NA IgG: <18 U/mL (ref 0.0–17.9)
EBV VCA IgG: 510 U/mL — ABNORMAL HIGH (ref 0.0–17.9)
EBV VCA IgM: <36 U/mL (ref 0.0–35.9)

## 2022-10-02 NOTE — Progress Notes (Signed)
Let her know that these are negative.   The one thing that is positive just means she's been infected in the past at some point.

## 2022-10-04 NOTE — Progress Notes (Signed)
Patient informed. 

## 2022-10-14 ENCOUNTER — Encounter: Payer: Self-pay | Admitting: Physician Assistant

## 2022-10-14 ENCOUNTER — Emergency Department
Admission: EM | Admit: 2022-10-14 | Discharge: 2022-10-14 | Disposition: A | Discharge status: Home / Self Care | Payer: 59 | Attending: Emergency Medicine | Admitting: Emergency Medicine

## 2022-10-14 ENCOUNTER — Other Ambulatory Visit: Payer: Self-pay

## 2022-10-14 DIAGNOSIS — R350 Frequency of micturition: Secondary | ICD-10-CM | POA: Diagnosis present

## 2022-10-14 DIAGNOSIS — R300 Dysuria: Secondary | ICD-10-CM | POA: Diagnosis not present

## 2022-10-14 DIAGNOSIS — I10 Essential (primary) hypertension: Secondary | ICD-10-CM | POA: Insufficient documentation

## 2022-10-14 LAB — URINALYSIS, ROUTINE W REFLEX MICROSCOPIC
Bilirubin Urine: NEGATIVE
Glucose, UA: NEGATIVE mg/dL
Hgb urine dipstick: NEGATIVE
Ketones, ur: NEGATIVE mg/dL
Leukocytes,Ua: NEGATIVE
Nitrite: NEGATIVE
Protein, ur: NEGATIVE mg/dL
Specific Gravity, Urine: 1.002 — ABNORMAL LOW (ref 1.005–1.030)
pH: 6 (ref 5.0–8.0)

## 2022-10-14 NOTE — ED Triage Notes (Signed)
Pt to ED via POV from home. Pt reports increased urinary frequency and burning with urination x 2 days.

## 2022-10-14 NOTE — Discharge Instructions (Addendum)
Your urinalysis was normal without any evidence of urinary tract infection, bacteria, or blood.  You should follow-up with your primary provider for ongoing evaluation if symptoms persist.

## 2022-10-14 NOTE — ED Provider Notes (Signed)
[facility] Emergency Department Provider Note     Event Date/Time   First MD Initiated Contact with Patient 10/14/22 1335     (approximate)   History   Urinary Frequency   HPI  Jolaine Hambric is a 62 y.o. female with a history of hypertension and kidney stones, presents to the ED for evaluation of urinary frequency and dysuria described as burning.  She reports 2 days worth of symptoms.  No fevers, chills, sweats reported.   Physical Exam   Triage Vital Signs: ED Triage Vitals [10/14/22 1254]  Enc Vitals Group     BP 129/85     Pulse Rate 79     Resp 18     Temp 98.1 F (36.7 C)     Temp Source Oral     SpO2 98 %     Weight      Height      Head Circumference      Peak Flow      Pain Score 5     Pain Loc      Pain Edu?      Excl. in [place]?     Most recent vital signs: Vitals:   10/14/22 1254  BP: 129/85  Pulse: 79  Resp: 18  Temp: 98.1 F (36.7 C)  SpO2: 98%    General Awake, no distress. NAD  CV:  Good peripheral perfusion.  RESP:  Normal effort.  ABD:  No distention.    ED Results / Procedures / Treatments   Labs (all labs ordered are listed, but only abnormal results are displayed) Labs Reviewed  URINALYSIS, ROUTINE W REFLEX MICROSCOPIC - Abnormal; Notable for the following components:      Result Value   Color, Urine COLORLESS (*)    APPearance CLEAR (*)    Specific Gravity, Urine 1.002 (*)    All other components within normal limits     EKG   RADIOLOGY  I personally viewed and evaluated these images as part of my medical decision making, as well as reviewing the written report by the radiologist.  ED Provider Interpretation:   No results found.   PROCEDURES:  Critical Care performed: No  Procedures   MEDICATIONS ORDERED IN ED: Medications - No data to display   IMPRESSION / MDM / [place] / ED COURSE  I reviewed the triage vital signs and the nursing notes.                               Differential diagnosis includes, but is not limited to, UTI, urinary retention, vaginitis  Patient's presentation is most consistent with acute complicated illness / injury requiring diagnostic workup.  Patient's diagnosis is consistent with dysuria without evidence of acute UTI.  Patient declined any further evaluation included pelvic exam Han[place] vaginal swabs.  Patient is to follow up with her primary provider as needed or otherwise directed. Patient is given ED precautions to return to the ED for any worsening or new symptoms.     FINAL CLINICAL IMPRESSION(S) / ED DIAGNOSES   Final diagnoses:  Dysuria     Rx / DC Orders   ED Discharge Orders     None        Note:  This document was prepared using Dragon voice recognition software and may include unintentional dictation errors.    Melvenia Needles, PA-C 10/14/22 [place],  Juanda Crumble, MD 10/14/22 [phone]

## 2022-10-18 ENCOUNTER — Telehealth: Payer: Self-pay

## 2022-10-18 NOTE — Telephone Encounter (Signed)
        Patient  visited [facility] on 10/14/2022  for Urinary Frequency.   Telephone encounter attempt :  1st  A HIPAA compliant voice message was left requesting a return call.  Instructed patient to call back at [phone].   [place] Resource Care Guide   [email or web address]  ?? [email or web address]   Website: [email or web address]  [email or web address]

## 2022-10-19 ENCOUNTER — Telehealth: Payer: Self-pay

## 2022-10-19 NOTE — Telephone Encounter (Signed)
        Patient  visited [facility] on 10/14/2022  for Urinary Frequency.   Telephone encounter attempt :  2nd  A HIPAA compliant voice message was left requesting a return call.  Instructed patient to call back at [phone].   [place] Resource Care Guide   [email or web address]  ?? [email or web address]   Website: [email or web address]  [email or web address]

## 2022-11-07 ENCOUNTER — Other Ambulatory Visit: Payer: Self-pay | Admitting: Family

## 2022-11-07 DIAGNOSIS — I10 Essential (primary) hypertension: Secondary | ICD-10-CM

## 2022-11-07 DIAGNOSIS — E782 Mixed hyperlipidemia: Secondary | ICD-10-CM

## 2022-12-29 ENCOUNTER — Ambulatory Visit (INDEPENDENT_AMBULATORY_CARE_PROVIDER_SITE_OTHER): Payer: 59 | Admitting: Family

## 2022-12-29 ENCOUNTER — Encounter: Payer: Self-pay | Admitting: Internal Medicine

## 2022-12-29 VITALS — BP 115/72 | HR 72 | Ht 63.0 in | Wt 154.4 lb

## 2022-12-29 DIAGNOSIS — E559 Vitamin D deficiency, unspecified: Secondary | ICD-10-CM | POA: Diagnosis not present

## 2022-12-29 DIAGNOSIS — Z6827 Body mass index (BMI) 27.0-27.9, adult: Secondary | ICD-10-CM

## 2022-12-29 DIAGNOSIS — F3160 Bipolar disorder, current episode mixed, unspecified: Secondary | ICD-10-CM

## 2022-12-29 DIAGNOSIS — R7303 Prediabetes: Secondary | ICD-10-CM

## 2022-12-29 DIAGNOSIS — E782 Mixed hyperlipidemia: Secondary | ICD-10-CM

## 2022-12-29 DIAGNOSIS — I10 Essential (primary) hypertension: Secondary | ICD-10-CM | POA: Diagnosis not present

## 2022-12-29 DIAGNOSIS — R5383 Other fatigue: Secondary | ICD-10-CM | POA: Diagnosis not present

## 2022-12-29 DIAGNOSIS — E538 Deficiency of other specified B group vitamins: Secondary | ICD-10-CM

## 2022-12-29 NOTE — Assessment & Plan Note (Signed)
Checking labs today.  Will continue supplements as needed.  

## 2022-12-29 NOTE — Assessment & Plan Note (Signed)
Blood pressure well controlled with current medications.  Continue current therapy.  Will reassess at follow up.  

## 2022-12-29 NOTE — Assessment & Plan Note (Signed)
Patient educated on foods that contain carbohydrates and the need to decrease intake.  We discussed prediabetes, and what it means and the need for strict dietary control to prevent progression to type 2 diabetes.  Advised to decrease intake of sugary drinks, including sodas, sweet tea, and some juices, and of starch and sugar heavy foods (ie., potatoes, rice, bread, pasta, desserts). She verbalizes understanding and agreement with the changes discussed today.   A1C Continues to be in prediabetic ranges.  Will reassess at follow up after next lab check.  Patient counseled on dietary choices and verbalized understanding.   

## 2022-12-29 NOTE — Assessment & Plan Note (Signed)
Checking labs today.  Continue current therapy for lipid control. Will modify as needed based on labwork results.  

## 2022-12-29 NOTE — Assessment & Plan Note (Signed)
Patient stable.  Well controlled with current therapy.   Patient sees psychiatry.   Continue current meds.

## 2022-12-29 NOTE — Progress Notes (Signed)
Established Patient Office Visit  Subjective:  Patient ID: Isabel Welch, female    DOB: [birth year]  Age: 62 y.o. MRN: [id number]  Chief Complaint  Patient presents with   Follow-up    4 month follow up    Patient is here today for her 4 months follow up.  She has been feeling well since last appointment.   She does have additional concerns to discuss today.  Had additional pelvic pain and pressure around 3 months ago, says that she went to the hospital, but she was found to have normal urine labs.   Labs are due today. She needs refills.   I have reviewed her active problem list, medication list, allergies, health maintenance, notes from last encounter, lab results for her appointment today.    No other concerns at this time.   Past Medical History:  Diagnosis Date   Hypertension    Kidney stone     Past Surgical History:  Procedure Laterality Date   gallbladder stents     LEAP     TUBAL LIGATION      Social History   Socioeconomic History   Marital status: Single    Spouse name: Not on file   Number of children: Not on file   Years of education: Not on file   Highest education level: Not on file  Occupational History   Not on file  Tobacco Use   Smoking status: Never   Smokeless tobacco: Never  Vaping Use   Vaping Use: Never used  Substance and Sexual Activity   Alcohol use: No   Drug use: Not on file   Sexual activity: Not on file  Other Topics Concern   Not on file  Social History Narrative   Not on file   Social Determinants of Health   Financial Resource Strain: Not on file  Food Insecurity: Not on file  Transportation Needs: Not on file  Physical Activity: Not on file  Stress: Not on file  Social Connections: Not on file  Intimate Partner Violence: Not on file    Family History  Problem Relation Age of Onset   Cancer Mother    Heart failure Father     No Known Allergies  Review of Systems  All other systems reviewed  and are negative.      Objective:   BP 115/72   Pulse 72   Ht 5\' 3"  (1.6 m)   Wt 154 lb 6.4 oz (70 kg)   SpO2 97%   BMI 27.35 kg/m   Vitals:   12/29/22 0855  BP: 115/72  Pulse: 72  Height: 5\' 3"  (1.6 m)  Weight: 154 lb 6.4 oz (70 kg)  SpO2: 97%  BMI (Calculated): 27.36    Physical Exam Vitals and nursing note reviewed.  Constitutional:      Appearance: Normal appearance. She is normal weight.  HENT:     Head: Normocephalic.  Eyes:     Pupils: Pupils are equal, round, and reactive to light.  Cardiovascular:     Rate and Rhythm: Normal rate.     Pulses: Normal pulses.  Pulmonary:     Effort: Pulmonary effort is normal.  Neurological:     General: No focal deficit present.     Mental Status: She is alert and oriented to person, place, and time. Mental status is at baseline.  Psychiatric:        Attention and Perception: Attention and perception normal.  Mood and Affect: Affect normal. Mood is anxious.        Speech: Speech normal.        Behavior: Behavior normal. Behavior is cooperative.        Thought Content: Thought content normal.        Cognition and Memory: Cognition and memory normal.        Judgment: Judgment is impulsive.      No results found for any visits on 12/29/22.  Recent Results (from the past 2160 hour(s))  Urinalysis, Routine w reflex microscopic -Urine, Clean Catch     Status: Abnormal   Collection Time: 10/14/22 12:56 PM  Result Value Ref Range   Color, Urine COLORLESS (A) YELLOW   APPearance CLEAR (A) CLEAR   Specific Gravity, Urine 1.002 (L) 1.005 - 1.030   pH 6.0 5.0 - 8.0   Glucose, UA NEGATIVE NEGATIVE mg/dL   Hgb urine dipstick NEGATIVE NEGATIVE   Bilirubin Urine NEGATIVE NEGATIVE   Ketones, ur NEGATIVE NEGATIVE mg/dL   Protein, ur NEGATIVE NEGATIVE mg/dL   Nitrite NEGATIVE NEGATIVE   Leukocytes,Ua NEGATIVE NEGATIVE    Comment: Performed at [facility], [address], [place], [place] [address]        Assessment & Plan:   Problem List Items Addressed This Visit       Active Problems   Bipolar affective disorder, mixed (HCC)    Patient stable.  Well controlled with current therapy.   Patient sees psychiatry.   Continue current meds.       Relevant Orders   CBC With Differential   CMP14+EGFR   Mixed hyperlipidemia - Primary    Checking labs today.  Continue current therapy for lipid control. Will modify as needed based on labwork results.       Relevant Orders   Lipid panel   CBC With Differential   CMP14+EGFR   Prediabetes    Patient educated on foods that contain carbohydrates and the need to decrease intake.  We discussed prediabetes, and what it means and the need for strict dietary control to prevent progression to type 2 diabetes.  Advised to decrease intake of sugary drinks, including sodas, sweet tea, and some juices, and of starch and sugar heavy foods (ie., potatoes, rice, bread, pasta, desserts). She verbalizes understanding and agreement with the changes discussed today.  A1C Continues to be in prediabetic ranges.  Will reassess at follow up after next lab check.  Patient counseled on dietary choices and verbalized understanding.       Relevant Orders   CBC With Differential   CMP14+EGFR   Hemoglobin A1c   Essential hypertension, benign    Blood pressure well controlled with current medications.  Continue current therapy.  Will reassess at follow up.       Relevant Orders   CBC With Differential   CMP14+EGFR   Vitamin D deficiency, unspecified    Checking labs today.  Will continue supplements as needed.       Relevant Orders   VITAMIN D 25 Hydroxy (Vit-D Deficiency, Fractures)   CBC With Differential   CMP14+EGFR   B12 deficiency due to diet    Checking labs today.  Will continue supplements as needed.       Relevant Orders   CBC With Differential   CMP14+EGFR   Vitamin B12   Other Visit Diagnoses     Other fatigue       Relevant  Orders   CBC With Differential   CMP14+EGFR  TSH   BMI 27.0-27.9,adult       Relevant Orders   CBC With Differential   CMP14+EGFR       Return in about 4 months (around 04/30/2023) for F/U.   Total time spent: 30 minutes  Miki Kins, FNP  12/29/2022   This document may have been prepared by [facility] Voice Recognition software and as such may include unintentional dictation errors.

## 2022-12-30 LAB — CBC WITH DIFFERENTIAL
Basophils Absolute: 0 10*3/uL (ref 0.0–0.2)
Basos: 1 %
EOS (ABSOLUTE): 0.1 10*3/uL (ref 0.0–0.4)
Eos: 2 %
Hematocrit: 41 % (ref 34.0–46.6)
Hemoglobin: 13.3 g/dL (ref 11.1–15.9)
Immature Grans (Abs): 0 10*3/uL (ref 0.0–0.1)
Immature Granulocytes: 0 %
Lymphocytes Absolute: 1.9 10*3/uL (ref 0.7–3.1)
Lymphs: 33 %
MCH: 29.8 pg (ref 26.6–33.0)
MCHC: 32.4 g/dL (ref 31.5–35.7)
MCV: 92 fL (ref 79–97)
Monocytes Absolute: 0.4 10*3/uL (ref 0.1–0.9)
Monocytes: 6 %
Neutrophils Absolute: 3.4 10*3/uL (ref 1.4–7.0)
Neutrophils: 58 %
RBC: 4.47 x10E6/uL (ref 3.77–5.28)
RDW: 12.2 % (ref 11.7–15.4)
WBC: 5.9 10*3/uL (ref 3.4–10.8)

## 2022-12-30 LAB — CMP14+EGFR
ALT: 16 IU/L (ref 0–32)
AST: 21 IU/L (ref 0–40)
Albumin: 4.3 g/dL (ref 3.8–4.9)
Alkaline Phosphatase: 67 IU/L (ref 44–121)
BUN/Creatinine Ratio: 14 (ref 12–28)
BUN: 13 mg/dL (ref 8–27)
Bilirubin Total: 0.5 mg/dL (ref 0.0–1.2)
CO2: 22 mmol/L (ref 20–29)
Calcium: 9.4 mg/dL (ref 8.7–10.3)
Chloride: 106 mmol/L (ref 96–106)
Creatinine, Ser: 0.91 mg/dL (ref 0.57–1.00)
Globulin, Total: 2.4 g/dL (ref 1.5–4.5)
Glucose: 94 mg/dL (ref 70–99)
Potassium: 4.4 mmol/L (ref 3.5–5.2)
Sodium: 142 mmol/L (ref 134–144)
Total Protein: 6.7 g/dL (ref 6.0–8.5)
eGFR: 72 mL/min/{1.73_m2} (ref >59)

## 2022-12-30 LAB — LIPID PANEL
Chol/HDL Ratio: 2.3 ratio (ref 0.0–4.4)
Cholesterol, Total: 154 mg/dL (ref 100–199)
HDL: 66 mg/dL (ref >39)
LDL Chol Calc (NIH): 75 mg/dL (ref 0–99)
Triglycerides: 68 mg/dL (ref 0–149)
VLDL Cholesterol Cal: 13 mg/dL (ref 5–40)

## 2022-12-30 LAB — HEMOGLOBIN A1C
Est. average glucose Bld gHb Est-mCnc: 120 mg/dL
Hgb A1c MFr Bld: 5.8 % — ABNORMAL HIGH (ref 4.8–5.6)

## 2022-12-30 LAB — TSH: TSH: 1.08 u[IU]/mL (ref 0.450–4.500)

## 2022-12-30 LAB — VITAMIN D 25 HYDROXY (VIT D DEFICIENCY, FRACTURES): Vit D, 25-Hydroxy: 61.2 ng/mL (ref 30.0–100.0)

## 2022-12-30 LAB — VITAMIN B12: Vitamin B-12: 418 pg/mL (ref 232–1245)

## 2023-02-05 ENCOUNTER — Telehealth: Payer: Self-pay

## 2023-02-05 NOTE — Telephone Encounter (Signed)
Error

## 2023-02-05 NOTE — Progress Notes (Signed)
Patient notified

## 2023-02-23 DIAGNOSIS — R519 Headache, unspecified: Secondary | ICD-10-CM | POA: Diagnosis not present

## 2023-02-23 DIAGNOSIS — Z20822 Contact with and (suspected) exposure to covid-19: Secondary | ICD-10-CM | POA: Diagnosis not present

## 2023-02-23 DIAGNOSIS — B349 Viral infection, unspecified: Secondary | ICD-10-CM | POA: Diagnosis not present

## 2023-02-23 DIAGNOSIS — M791 Myalgia, unspecified site: Secondary | ICD-10-CM | POA: Diagnosis not present

## 2023-02-26 ENCOUNTER — Telehealth: Payer: Self-pay

## 2023-02-26 ENCOUNTER — Ambulatory Visit (INDEPENDENT_AMBULATORY_CARE_PROVIDER_SITE_OTHER): Payer: 59 | Admitting: Family

## 2023-02-26 DIAGNOSIS — J029 Acute pharyngitis, unspecified: Secondary | ICD-10-CM

## 2023-02-26 LAB — POCT RAPID STREP A (OFFICE): Rapid Strep A Screen: NEGATIVE

## 2023-02-26 NOTE — Progress Notes (Signed)
   Subjective   CHIEF COMPLAINT  Strep Test   REASON FOR VISIT  Sore throat - Strep test only.      Objective   Results for orders placed or performed in visit on 02/26/23  POCT rapid strep A  Result Value Ref Range   Rapid Strep A Screen Negative Negative    Assessment & Plan  1. Sore throat Strep test in office today is negative.  Patient notified. - POCT rapid strep A   Total time spent: 5 minutes  Miki Kins, FNP 02/26/2023

## 2023-02-26 NOTE — Telephone Encounter (Signed)
Left voicemail informing pt that strep test came back negative.

## 2023-03-21 ENCOUNTER — Ambulatory Visit (INDEPENDENT_AMBULATORY_CARE_PROVIDER_SITE_OTHER): Payer: 59 | Admitting: Family

## 2023-03-21 ENCOUNTER — Other Ambulatory Visit: Payer: 59

## 2023-03-21 ENCOUNTER — Encounter: Payer: Self-pay | Admitting: Family

## 2023-03-21 ENCOUNTER — Other Ambulatory Visit: Payer: Self-pay | Admitting: Family

## 2023-03-21 VITALS — BP 132/71 | HR 83 | Ht 63.0 in | Wt 164.8 lb

## 2023-03-21 DIAGNOSIS — E782 Mixed hyperlipidemia: Secondary | ICD-10-CM

## 2023-03-21 DIAGNOSIS — E538 Deficiency of other specified B group vitamins: Secondary | ICD-10-CM | POA: Diagnosis not present

## 2023-03-21 DIAGNOSIS — R7303 Prediabetes: Secondary | ICD-10-CM

## 2023-03-21 DIAGNOSIS — E559 Vitamin D deficiency, unspecified: Secondary | ICD-10-CM

## 2023-03-21 DIAGNOSIS — R42 Dizziness and giddiness: Secondary | ICD-10-CM

## 2023-03-21 DIAGNOSIS — F3160 Bipolar disorder, current episode mixed, unspecified: Secondary | ICD-10-CM

## 2023-03-21 DIAGNOSIS — I10 Essential (primary) hypertension: Secondary | ICD-10-CM | POA: Diagnosis not present

## 2023-03-21 LAB — POCT CBG (FASTING - GLUCOSE)-MANUAL ENTRY: Glucose Fasting, POC: 104 mg/dL — AB (ref 70–99)

## 2023-03-21 MED ORDER — PREDNISONE 20 MG PO TABS: 40.0000 mg | ORAL_TABLET | Freq: Every day | ORAL | 5 refills | Status: DC

## 2023-03-21 MED ORDER — AMOXICILLIN-POT CLAVULANATE 875-125 MG PO TABS: 1.0000 | ORAL_TABLET | Freq: Two times a day (BID) | ORAL | 0 refills | Status: DC

## 2023-03-21 NOTE — Progress Notes (Signed)
Established Patient Office Visit  Subjective:  Patient ID: Isabel Welch, female    DOB: May 26, 1961  Age: 62 y.o. MRN: [id number]  No chief complaint on file.   Having dizzy spells.  Had a sore throat and a fever a when she came in recently for that strep test.  She says that she is feeling better, but she still has been having some dizzy spells that are causing her trouble almost daily.   No other concerns at this time.   Past Medical History:  Diagnosis Date   Hypertension    Kidney stone     Past Surgical History:  Procedure Laterality Date   gallbladder stents     LEAP     TUBAL LIGATION      Social History   Socioeconomic History   Marital status: Single    Spouse name: Not on file   Number of children: Not on file   Years of education: Not on file   Highest education level: Not on file  Occupational History   Not on file  Tobacco Use   Smoking status: Never   Smokeless tobacco: Never  Vaping Use   Vaping status: Never Used  Substance and Sexual Activity   Alcohol use: No   Drug use: Never   Sexual activity: Yes  Other Topics Concern   Not on file  Social History Narrative   Not on file   Social Determinants of Health   Financial Resource Strain: Not on file  Food Insecurity: Not on file  Transportation Needs: Not on file  Physical Activity: Not on file  Stress: Not on file  Social Connections: Not on file  Intimate Partner Violence: Not on file    Family History  Problem Relation Age of Onset   Cancer Mother    Heart failure Father     No Known Allergies  Review of Systems  Neurological:  Positive for dizziness.  All other systems reviewed and are negative.      Objective:   BP 132/71   Pulse 83   Ht 5\' 3"  (1.6 m)   Wt 164 lb 12.8 oz (74.8 kg)   SpO2 98%   BMI 29.19 kg/m   Vitals:   03/21/23 1454  BP: 132/71  Pulse: 83  Height: 5\' 3"  (1.6 m)  Weight: 164 lb 12.8 oz (74.8 kg)  SpO2: 98%  BMI (Calculated):  29.2    Physical Exam Vitals and nursing note reviewed.  Constitutional:      Appearance: Normal appearance. She is normal weight.  HENT:     Head: Normocephalic.  Eyes:     Pupils: Pupils are equal, round, and reactive to light.  Cardiovascular:     Rate and Rhythm: Normal rate.  Pulmonary:     Effort: Pulmonary effort is normal.  Neurological:     General: No focal deficit present.     Mental Status: She is alert and oriented to person, place, and time. Mental status is at baseline.  Psychiatric:        Mood and Affect: Mood normal.        Behavior: Behavior normal.        Thought Content: Thought content normal.        Judgment: Judgment normal.      Results for orders placed or performed in visit on 03/21/23  POCT CBG (Fasting - Glucose)  Result Value Ref Range   Glucose Fasting, POC 104 (A) 70 - 99 mg/dL  Results  for orders placed or performed in visit on 03/21/23  CMP14+EGFR  Result Value Ref Range   Glucose 76 70 - 99 mg/dL   BUN 15 8 - 27 mg/dL   Creatinine, Ser 6.29 0.57 - 1.00 mg/dL   eGFR 71 >52 WU/XLK/4.40   BUN/Creatinine Ratio 16 12 - 28   Sodium 141 134 - 144 mmol/L   Potassium 4.2 3.5 - 5.2 mmol/L   Chloride 103 96 - 106 mmol/L   CO2 23 20 - 29 mmol/L   Calcium 9.0 8.7 - 10.3 mg/dL   Total Protein 6.5 6.0 - 8.5 g/dL   Albumin 4.1 3.8 - 4.9 g/dL   Globulin, Total 2.4 1.5 - 4.5 g/dL   Bilirubin Total 0.6 0.0 - 1.2 mg/dL   Alkaline Phosphatase 83 44 - 121 IU/L   AST 21 0 - 40 IU/L   ALT 14 0 - 32 IU/L  Vitamin B12  Result Value Ref Range   Vitamin B-12 560 232 - 1,245 pg/mL    Recent Results (from the past 2160 hour(s))  POCT rapid strep A     Status: None   Collection Time: 02/26/23 11:17 AM  Result Value Ref Range   Rapid Strep A Screen Negative Negative  CMP14+EGFR     Status: None   Collection Time: 03/21/23  9:19 AM  Result Value Ref Range   Glucose 76 70 - 99 mg/dL   BUN 15 8 - 27 mg/dL   Creatinine, Ser 1.02 0.57 - 1.00 mg/dL    eGFR 71 >72 ZD/GUY/4.03   BUN/Creatinine Ratio 16 12 - 28   Sodium 141 134 - 144 mmol/L   Potassium 4.2 3.5 - 5.2 mmol/L   Chloride 103 96 - 106 mmol/L   CO2 23 20 - 29 mmol/L   Calcium 9.0 8.7 - 10.3 mg/dL   Total Protein 6.5 6.0 - 8.5 g/dL   Albumin 4.1 3.8 - 4.9 g/dL   Globulin, Total 2.4 1.5 - 4.5 g/dL   Bilirubin Total 0.6 0.0 - 1.2 mg/dL   Alkaline Phosphatase 83 44 - 121 IU/L   AST 21 0 - 40 IU/L   ALT 14 0 - 32 IU/L  Vitamin B12     Status: None   Collection Time: 03/21/23  9:19 AM  Result Value Ref Range   Vitamin B-12 560 232 - 1,245 pg/mL  POCT CBG (Fasting - Glucose)     Status: Abnormal   Collection Time: 03/21/23  3:09 PM  Result Value Ref Range   Glucose Fasting, POC 104 (A) 70 - 99 mg/dL       Assessment & Plan:   Problem List Items Addressed This Visit   None Visit Diagnoses     Dizziness    -  Primary   EKG In office today WNL.  Pt. will let me know if these continue to happen.   Relevant Orders   EKG 12-Lead   POCT CBG (Fasting - Glucose) (Completed)       Return in about 2 weeks (around 04/04/2023) for F/U.   Total time spent: 20 minutes  Miki Kins, FNP  03/21/2023   This document may have been prepared by [facility] Voice Recognition software and as such may include unintentional dictation errors.

## 2023-03-22 LAB — CMP14+EGFR
ALT: 14 IU/L (ref 0–32)
AST: 21 IU/L (ref 0–40)
Albumin: 4.1 g/dL (ref 3.8–4.9)
Alkaline Phosphatase: 83 IU/L (ref 44–121)
BUN/Creatinine Ratio: 16 (ref 12–28)
BUN: 15 mg/dL (ref 8–27)
Bilirubin Total: 0.6 mg/dL (ref 0.0–1.2)
CO2: 23 mmol/L (ref 20–29)
Calcium: 9 mg/dL (ref 8.7–10.3)
Chloride: 103 mmol/L (ref 96–106)
Creatinine, Ser: 0.92 mg/dL (ref 0.57–1.00)
Globulin, Total: 2.4 g/dL (ref 1.5–4.5)
Glucose: 76 mg/dL (ref 70–99)
Potassium: 4.2 mmol/L (ref 3.5–5.2)
Sodium: 141 mmol/L (ref 134–144)
Total Protein: 6.5 g/dL (ref 6.0–8.5)
eGFR: 71 mL/min/{1.73_m2} (ref >59)

## 2023-03-22 LAB — VITAMIN B12: Vitamin B-12: 560 pg/mL (ref 232–1245)

## 2023-03-29 ENCOUNTER — Other Ambulatory Visit: Payer: Self-pay

## 2023-03-29 ENCOUNTER — Encounter: Payer: Self-pay | Admitting: Emergency Medicine

## 2023-03-29 ENCOUNTER — Encounter: Payer: Self-pay | Admitting: Family

## 2023-03-29 ENCOUNTER — Emergency Department
Admission: EM | Admit: 2023-03-29 | Discharge: 2023-03-29 | Disposition: A | Discharge status: Home / Self Care | Payer: 59 | Attending: Emergency Medicine | Admitting: Emergency Medicine

## 2023-03-29 DIAGNOSIS — E119 Type 2 diabetes mellitus without complications: Secondary | ICD-10-CM | POA: Insufficient documentation

## 2023-03-29 DIAGNOSIS — I10 Essential (primary) hypertension: Secondary | ICD-10-CM | POA: Diagnosis not present

## 2023-03-29 DIAGNOSIS — R519 Headache, unspecified: Secondary | ICD-10-CM | POA: Diagnosis not present

## 2023-03-29 MED ORDER — CYCLOBENZAPRINE HCL 10 MG PO TABS: 10.0000 mg | ORAL_TABLET | Freq: Three times a day (TID) | ORAL | 0 refills | Status: AC | PRN

## 2023-03-29 NOTE — Discharge Instructions (Addendum)
You were seen in the ER today for evaluation of your headache. Your exam here was fortunately reassuring. Please arrange follow-up with a primary care doctor within the next few days for reevaluation if your symptoms have not improved.  I sent a prescription for short course of a muscle relaxer that you can use as needed to help with your symptoms.  Do not drive or operate machinery when taking this.  Return to the ER if you develop new or worsening headache, fever, neck stiffness, changes in vision, difficulty walking, weakness, dizziness, confusion, vomiting, numbness, tingling, or any other new or concerning symptoms that you believe warrants immediate attention.

## 2023-03-29 NOTE — ED Provider Notes (Signed)
[facility] Provider Note    Event Date/Time   First MD Initiated Contact with Patient 03/29/23 [phone]     (approximate)   History   Headache   HPI  Isabel Welch is a 62 year old female with hx DM, HTN, migraines presenting to the ER for evaluation of headache.  Patient reports a history of migraines and had onset of a headache 3 weeks ago, not sudden in onset or different in character from prior.  She tried taking Tylenol which usually improves her headaches, but has had a persistent headache.  Has seen her primary care doctor who placed her on prednisone and amoxicillin for possible eustachian tube dysfunction.  No fevers or chills.  No new numbness, tingling, focal weakness.  She does work as a Arboriculturist and says that she has been doing some increased heavy lifting recently and does report some soreness in her neck muscles as well.  No nausea, vomiting, vision changes.  Reports the pain is manageable and she is able to work with it.  Does have a family member with what sounds like a hemorrhagic stroke that made patient concerned leading to ER presentation.     Physical Exam   Triage Vital Signs: ED Triage Vitals  Encounter Vitals Group     BP 03/29/23 0842 127/83     Systolic BP Percentile --      Diastolic BP Percentile --      Pulse Rate 03/29/23 0842 72     Resp 03/29/23 0842 16     Temp 03/29/23 0842 98 F (36.7 C)     Temp Source 03/29/23 0842 Oral     SpO2 03/29/23 0842 99 %     Weight 03/29/23 0843 164 lb 10.9 oz (74.7 kg)     Height 03/29/23 0843 5\' 3"  (1.6 m)     Head Circumference --      Peak Flow --      Pain Score 03/29/23 0843 5     Pain Loc --      Pain Education --      Exclude from Growth Chart --     Most recent vital signs: Vitals:   03/29/23 0842  BP: 127/83  Pulse: 72  Resp: 16  Temp: 98 F (36.7 C)  SpO2: 99%     General: Awake, interactive  CV:  Regular rate, good peripheral perfusion.   Resp:  Unlabored respirations.  Abd:  Nondistended.  Neuro:  Alert and oriented, normal extraocular movements, symmetric facial movement, sensation intact over bilateral upper and lower extremities with 5 out of 5 strength.  Normal finger-to-nose testing.   MSK:   No midline spinal tenderness, some reproducible tenderness to palpation over the right paraspinous musculature.  Negative Spurling sign bilaterally   ED Results / Procedures / Treatments   Labs (all labs ordered are listed, but only abnormal results are displayed) Labs Reviewed - No data to display   EKG EKG independently reviewed interpreted by myself (ER attending) demonstrates:    RADIOLOGY Imaging independently reviewed and interpreted by myself demonstrates:    PROCEDURES:  Critical Care performed: No  Procedures   MEDICATIONS ORDERED IN ED: Medications - No data to display   IMPRESSION / MDM / ASSESSMENT AND PLAN / ED COURSE  I reviewed the triage vital signs and the nursing notes.  Differential diagnosis includes, but is not limited to, benign headache, no focal deficits, vision changes, fever suggestive of emergent pathology of headache, clinical exam and  history not suggestive of acute CVA or subarachnoid hemorrhage  Patient's presentation is most consistent with acute illness / injury with system symptoms.  62 year old female presenting with headache without red flag features.  Vital signs stable here.  Discussed further workup such as imaging, but patient comfortable with holding off on this which I do think is reasonable.  Discussed supportive care.  Does seem to have some muscle strain in her neck so will DC with short course of muscle relaxer.  Strict return precautions provided.  Patient discharged in stable condition.      FINAL CLINICAL IMPRESSION(S) / ED DIAGNOSES   Final diagnoses:  Acute nonintractable headache, unspecified headache type     Rx / DC Orders   ED Discharge Orders           Ordered    cyclobenzaprine (FLEXERIL) 10 MG tablet  3 times daily PRN        03/29/23 1914             Note:  This document was prepared using Dragon voice recognition software and may include unintentional dictation errors.   Trinna Post, MD 03/29/23 [phone]

## 2023-03-29 NOTE — ED Triage Notes (Signed)
Pt reports right sided headache for the past 3 weeks, states that she has been growing concerned due to family hx of strokes and brother with a brain bleed, denies injury

## 2023-04-04 ENCOUNTER — Encounter: Payer: Self-pay | Admitting: Family

## 2023-04-04 ENCOUNTER — Ambulatory Visit (INDEPENDENT_AMBULATORY_CARE_PROVIDER_SITE_OTHER): Payer: 59 | Admitting: Family

## 2023-04-04 VITALS — BP 102/80 | HR 78 | Resp 98 | Ht 63.0 in | Wt 162.2 lb

## 2023-04-04 DIAGNOSIS — F3160 Bipolar disorder, current episode mixed, unspecified: Secondary | ICD-10-CM | POA: Diagnosis not present

## 2023-04-04 DIAGNOSIS — K219 Gastro-esophageal reflux disease without esophagitis: Secondary | ICD-10-CM | POA: Diagnosis not present

## 2023-04-04 DIAGNOSIS — S161XXD Strain of muscle, fascia and tendon at neck level, subsequent encounter: Secondary | ICD-10-CM

## 2023-04-04 MED ORDER — OMEPRAZOLE MAGNESIUM 20 MG PO TBEC: 20.0000 mg | DELAYED_RELEASE_TABLET | Freq: Every day | ORAL | 1 refills | Status: DC

## 2023-04-04 NOTE — Progress Notes (Signed)
Acute Office Visit  Subjective:     Patient ID: Isabel Welch, female    DOB: 1960/11/15, 62 y.o.   MRN: [id number]  Patient is in today for  Chief Complaint  Patient presents with   Follow-up    Patient is here for her 2 week follow up.  She has been doing okay since her last appointment, but she has been having headaches, etc. She had one that lasted for several days, did go to the emergency room.  She admits that she has been doing more physical labor at work, was very concerned about possibly having a stroke, as she had a family member who had a hemorrhagic stroke in the past.  This has resolved, she has been reassured that this is likely due to her overexertion at work.   Her heartburn has come back. She does admit that she has been eating more poorly lately, with more fried foods and fast food.      Review of Systems  Gastrointestinal:  Positive for heartburn.  Musculoskeletal:  Positive for neck pain.  All other systems reviewed and are negative.       Objective:    BP 102/80   Pulse 78   Resp (!) 98   Ht 5\' 3"  (1.6 m)   Wt 162 lb 3.2 oz (73.6 kg)   BMI 28.73 kg/m   Physical Exam Vitals and nursing note reviewed.  Constitutional:      Appearance: Normal appearance. She is normal weight.  HENT:     Head: Normocephalic.  Eyes:     Pupils: Pupils are equal, round, and reactive to light.  Cardiovascular:     Rate and Rhythm: Normal rate.  Pulmonary:     Effort: Pulmonary effort is normal.  Musculoskeletal:     Cervical back: Tenderness present.  Neurological:     General: No focal deficit present.     Mental Status: She is alert and oriented to person, place, and time. Mental status is at baseline.  Psychiatric:        Mood and Affect: Mood normal.        Behavior: Behavior normal.        Thought Content: Thought content normal.        Judgment: Judgment normal.     No results found for any visits on 04/04/23.  Recent Results (from the  past 2160 hour(s))  POCT rapid strep A     Status: None   Collection Time: 02/26/23 11:17 AM  Result Value Ref Range   Rapid Strep A Screen Negative Negative  CMP14+EGFR     Status: None   Collection Time: 03/21/23  9:19 AM  Result Value Ref Range   Glucose 76 70 - 99 mg/dL   BUN 15 8 - 27 mg/dL   Creatinine, Ser 6.21 0.57 - 1.00 mg/dL   eGFR 71 >30 QM/VHQ/4.69   BUN/Creatinine Ratio 16 12 - 28   Sodium 141 134 - 144 mmol/L   Potassium 4.2 3.5 - 5.2 mmol/L   Chloride 103 96 - 106 mmol/L   CO2 23 20 - 29 mmol/L   Calcium 9.0 8.7 - 10.3 mg/dL   Total Protein 6.5 6.0 - 8.5 g/dL   Albumin 4.1 3.8 - 4.9 g/dL   Globulin, Total 2.4 1.5 - 4.5 g/dL   Bilirubin Total 0.6 0.0 - 1.2 mg/dL   Alkaline Phosphatase 83 44 - 121 IU/L   AST 21 0 - 40 IU/L   ALT 14 0 -  32 IU/L  Vitamin B12     Status: None   Collection Time: 03/21/23  9:19 AM  Result Value Ref Range   Vitamin B-12 560 232 - 1,245 pg/mL  POCT CBG (Fasting - Glucose)     Status: Abnormal   Collection Time: 03/21/23  3:09 PM  Result Value Ref Range   Glucose Fasting, POC 104 (A) 70 - 99 mg/dL       Assessment & Plan:   Problem List Items Addressed This Visit       Active Problems   Bipolar affective disorder, mixed (HCC)    Patient is seen by Psychiatry, who manage this condition.  She is well controlled with current therapy.   Will defer to them for further changes to plan of care.       Gastroesophageal reflux disease without esophagitis    Starting omeprazole.  Discussed risks/benefits with pt.  Will recheck at follow up.       Relevant Medications   omeprazole (PRILOSEC OTC) 20 MG tablet   Other Visit Diagnoses     Neck muscle strain, subsequent encounter    -  Primary   Continue the muscle relaxers given from ED as needed.  Patient will let me know if these do not continue to help with her issues.        Return in about 3 months (around 07/04/2023).  Total time spent: 20 minutes  Miki Kins, FNP  04/04/2023   This document may have been prepared by [facility] Voice Recognition software and as such may include unintentional dictation errors.

## 2023-04-04 NOTE — Assessment & Plan Note (Signed)
Starting omeprazole.  Discussed risks/benefits with pt.  Will recheck at follow up.

## 2023-04-04 NOTE — Assessment & Plan Note (Signed)
Patient is seen by Psychiatry, who manage this condition.  She is well controlled with current therapy.   Will defer to them for further changes to plan of care.

## 2023-04-30 ENCOUNTER — Ambulatory Visit: Payer: 59 | Admitting: Family

## 2023-05-03 ENCOUNTER — Telehealth: Payer: Self-pay

## 2023-05-03 NOTE — Patient Outreach (Signed)
[facility] Assistant attempted to call patient on today regarding preventative mammogram screening. No answer from patient after multiple rings. Assistant left confidential voicemail for patient to return call.  Will call back patient back for final attempt.  Baruch Gouty [facility] Assistant [facility] [phone]

## 2023-07-02 ENCOUNTER — Other Ambulatory Visit: Payer: Self-pay | Admitting: Family

## 2023-07-02 DIAGNOSIS — E782 Mixed hyperlipidemia: Secondary | ICD-10-CM

## 2023-07-02 DIAGNOSIS — I10 Essential (primary) hypertension: Secondary | ICD-10-CM

## 2023-07-04 ENCOUNTER — Ambulatory Visit: Payer: 59 | Admitting: Family

## 2023-09-20 DIAGNOSIS — Z79899 Other long term (current) drug therapy: Secondary | ICD-10-CM | POA: Diagnosis not present

## 2023-09-27 ENCOUNTER — Encounter: Payer: Self-pay | Admitting: Family

## 2023-09-27 ENCOUNTER — Ambulatory Visit: Admitting: Family

## 2023-09-27 VITALS — BP 121/71 | HR 78 | Ht 63.0 in | Wt 162.0 lb

## 2023-09-27 DIAGNOSIS — R5383 Other fatigue: Secondary | ICD-10-CM

## 2023-09-27 DIAGNOSIS — I10 Essential (primary) hypertension: Secondary | ICD-10-CM

## 2023-09-27 DIAGNOSIS — R42 Dizziness and giddiness: Secondary | ICD-10-CM

## 2023-09-27 DIAGNOSIS — E538 Deficiency of other specified B group vitamins: Secondary | ICD-10-CM

## 2023-09-27 DIAGNOSIS — E559 Vitamin D deficiency, unspecified: Secondary | ICD-10-CM

## 2023-09-27 DIAGNOSIS — Z114 Encounter for screening for human immunodeficiency virus [HIV]: Secondary | ICD-10-CM

## 2023-09-27 DIAGNOSIS — Z1159 Encounter for screening for other viral diseases: Secondary | ICD-10-CM

## 2023-09-27 DIAGNOSIS — R7303 Prediabetes: Secondary | ICD-10-CM

## 2023-09-27 DIAGNOSIS — E782 Mixed hyperlipidemia: Secondary | ICD-10-CM

## 2023-09-27 DIAGNOSIS — F3160 Bipolar disorder, current episode mixed, unspecified: Secondary | ICD-10-CM

## 2023-09-27 DIAGNOSIS — Z1231 Encounter for screening mammogram for malignant neoplasm of breast: Secondary | ICD-10-CM

## 2023-09-27 NOTE — Assessment & Plan Note (Signed)
 Checking labs today.  Will continue supplements as needed.

## 2023-09-27 NOTE — Assessment & Plan Note (Signed)
 Blood pressure well controlled with current medications.  Continue current therapy.  Will reassess at follow up.

## 2023-09-27 NOTE — Progress Notes (Signed)
 Established Patient Office Visit  Subjective:  Patient ID: Isabel Welch, female    DOB: [birth year]  Age: 63 y.o. MRN: [id number]  Chief Complaint  Patient presents with   Follow-up    Follow up with labs     Patient is here today for her 3 months follow up.  She has been feeling fairly well since last appointment.   She does not have additional concerns to discuss today.  Labs are due today. She needs refills.   I have reviewed her active problem list, medication list, allergies, health maintenance, notes from last encounter, lab results for her appointment today.    No other concerns at this time.   Past Medical History:  Diagnosis Date   Hypertension    Kidney stone     Past Surgical History:  Procedure Laterality Date   gallbladder stents     LEAP     TUBAL LIGATION      Social History   Socioeconomic History   Marital status: Single    Spouse name: Not on file   Number of children: Not on file   Years of education: Not on file   Highest education level: Not on file  Occupational History   Not on file  Tobacco Use   Smoking status: Never   Smokeless tobacco: Never  Vaping Use   Vaping status: Never Used  Substance and Sexual Activity   Alcohol use: No   Drug use: Never   Sexual activity: Yes  Other Topics Concern   Not on file  Social History Narrative   Not on file   Social Drivers of Health   Financial Resource Strain: Not on file  Food Insecurity: Not on file  Transportation Needs: Not on file  Physical Activity: Not on file  Stress: Not on file  Social Connections: Not on file  Intimate Partner Violence: Not on file    Family History  Problem Relation Age of Onset   Cancer Mother    Heart failure Father     No Known Allergies  Review of Systems  Constitutional:  Positive for malaise/fatigue.  All other systems reviewed and are negative.      Objective:   BP 121/71   Pulse 78   Ht 5\' 3"  (1.6 m)   Wt 162 lb  (73.5 kg)   SpO2 99%   BMI 28.70 kg/m   Vitals:   09/27/23 1032  BP: 121/71  Pulse: 78  Height: 5\' 3"  (1.6 m)  Weight: 162 lb (73.5 kg)  SpO2: 99%  BMI (Calculated): 28.7    Physical Exam Vitals and nursing note reviewed.  Constitutional:      Appearance: Normal appearance. She is normal weight.  HENT:     Head: Normocephalic.  Eyes:     Extraocular Movements: Extraocular movements intact.     Conjunctiva/sclera: Conjunctivae normal.     Pupils: Pupils are equal, round, and reactive to light.  Cardiovascular:     Rate and Rhythm: Normal rate and regular rhythm.     Pulses: Normal pulses.     Heart sounds: Normal heart sounds.  Pulmonary:     Effort: Pulmonary effort is normal.     Breath sounds: Normal breath sounds.  Musculoskeletal:        General: Normal range of motion.     Cervical back: Normal range of motion.  Neurological:     General: No focal deficit present.     Mental Status: She is alert and  oriented to person, place, and time. Mental status is at baseline.  Psychiatric:        Attention and Perception: Attention normal.        Mood and Affect: Mood and affect normal.        Speech: Speech normal.        Behavior: Behavior normal. Behavior is cooperative.        Thought Content: Thought content normal.        Cognition and Memory: Cognition normal.        Judgment: Judgment is impulsive.      No results found for any visits on 09/27/23.  No results found for this or any previous visit (from the past 2160 hours).     Assessment & Plan:   Problem List Items Addressed This Visit       Cardiovascular and Mediastinum   Essential hypertension, benign   Blood pressure well controlled with current medications.  Continue current therapy.  Will reassess at follow up.        Relevant Orders   CMP14+EGFR   CBC with Diff     Other   Bipolar affective disorder, mixed (HCC)   Patient stable.  Well controlled with current therapy.   Continue  current meds.        Mixed hyperlipidemia   Checking labs today.  Continue current therapy for lipid control. Will modify as needed based on labwork results.       Relevant Orders   Lipid panel   CMP14+EGFR   CBC with Diff   Prediabetes   Patient educated on foods that contain carbohydrates and the need to decrease intake.  We discussed prediabetes, and what it means and the need for strict dietary control to prevent progression to type 2 diabetes.  Advised to decrease intake of sugary drinks, including sodas, sweet tea, and some juices, and of starch and sugar heavy foods (ie., potatoes, rice, bread, pasta, desserts). She verbalizes understanding and agreement with the changes discussed today.  A1C Continues to be in prediabetic ranges.  Will reassess at follow up after next lab check.  Patient counseled on dietary choices and verbalized understanding.        Relevant Orders   CMP14+EGFR   Hemoglobin A1c   CBC with Diff   Vitamin D deficiency, unspecified   Checking labs today.  Will continue supplements as needed.        Relevant Orders   VITAMIN D 25 Hydroxy (Vit-D Deficiency, Fractures)   CMP14+EGFR   CBC with Diff   B12 deficiency due to diet - Primary   Checking labs today.  Will continue supplements as needed.        Relevant Orders   CMP14+EGFR   Vitamin B12   CBC with Diff   Other Visit Diagnoses       Dizziness       Relevant Orders   CMP14+EGFR   CBC with Diff     Other fatigue       Relevant Orders   CMP14+EGFR   TSH   CBC with Diff   Iron, TIBC and Ferritin Panel     Screening for HIV without presence of risk factors       Test ordered today.  Will call with results when available   Relevant Orders   CMP14+EGFR   CBC with Diff   HIV antibody (with reflex)     Need for hepatitis C screening test       Test  ordered today.  Will call with results when available   Relevant Orders   CMP14+EGFR   CBC with Diff   Hepatitis C Ab reflex to  Quant PCR     Screening mammogram for breast cancer       Mammogram ordered today.  Pt aware they will cal lher to schedule.   Relevant Orders   MM 3D SCREENING MAMMOGRAM BILATERAL BREAST       Return in about 3 months (around 12/28/2023) for F/U.   Total time spent: 20 minutes  Miki Kins, FNP  09/27/2023   This document may have been prepared by [facility] Voice Recognition software and as such may include unintentional dictation errors.

## 2023-09-27 NOTE — Assessment & Plan Note (Signed)

## 2023-09-27 NOTE — Assessment & Plan Note (Signed)
 Checking labs today.  Continue current therapy for lipid control. Will modify as needed based on labwork results.

## 2023-09-27 NOTE — Assessment & Plan Note (Signed)
 Patient stable.  Well controlled with current therapy.   Continue current meds.

## 2023-09-28 ENCOUNTER — Other Ambulatory Visit: Payer: Self-pay

## 2023-09-28 LAB — CMP14+EGFR
ALT: 12 IU/L (ref 0–32)
AST: 16 IU/L (ref 0–40)
Albumin: 4.5 g/dL (ref 3.9–4.9)
Alkaline Phosphatase: 70 IU/L (ref 44–121)
BUN/Creatinine Ratio: 8 — ABNORMAL LOW (ref 12–28)
BUN: 9 mg/dL (ref 8–27)
Bilirubin Total: 0.3 mg/dL (ref 0.0–1.2)
CO2: 21 mmol/L (ref 20–29)
Calcium: 9.6 mg/dL (ref 8.7–10.3)
Chloride: 102 mmol/L (ref 96–106)
Creatinine, Ser: 1.08 mg/dL — ABNORMAL HIGH (ref 0.57–1.00)
Globulin, Total: 2.4 g/dL (ref 1.5–4.5)
Glucose: 85 mg/dL (ref 70–99)
Potassium: 4.5 mmol/L (ref 3.5–5.2)
Sodium: 138 mmol/L (ref 134–144)
Total Protein: 6.9 g/dL (ref 6.0–8.5)
eGFR: 58 mL/min/{1.73_m2} — ABNORMAL LOW (ref >59)

## 2023-09-28 LAB — CBC WITH DIFFERENTIAL/PLATELET
Basophils Absolute: 0 10*3/uL (ref 0.0–0.2)
Basos: 1 %
EOS (ABSOLUTE): 0.1 10*3/uL (ref 0.0–0.4)
Eos: 2 %
Hematocrit: 39.1 % (ref 34.0–46.6)
Hemoglobin: 13 g/dL (ref 11.1–15.9)
Immature Grans (Abs): 0 10*3/uL (ref 0.0–0.1)
Immature Granulocytes: 0 %
Lymphocytes Absolute: 2.3 10*3/uL (ref 0.7–3.1)
Lymphs: 40 %
MCH: 29.7 pg (ref 26.6–33.0)
MCHC: 33.2 g/dL (ref 31.5–35.7)
MCV: 90 fL (ref 79–97)
Monocytes Absolute: 0.4 10*3/uL (ref 0.1–0.9)
Monocytes: 6 %
Neutrophils Absolute: 3 10*3/uL (ref 1.4–7.0)
Neutrophils: 51 %
Platelets: 304 10*3/uL (ref 150–450)
RBC: 4.37 x10E6/uL (ref 3.77–5.28)
RDW: 11.9 % (ref 11.7–15.4)
WBC: 5.8 10*3/uL (ref 3.4–10.8)

## 2023-09-28 LAB — TSH: TSH: 0.593 u[IU]/mL (ref 0.450–4.500)

## 2023-09-28 LAB — HCV INTERPRETATION

## 2023-09-28 LAB — LIPID PANEL
Chol/HDL Ratio: 2.7 ratio (ref 0.0–4.4)
Cholesterol, Total: 129 mg/dL (ref 100–199)
HDL: 47 mg/dL (ref >39)
LDL Chol Calc (NIH): 69 mg/dL (ref 0–99)
Triglycerides: 62 mg/dL (ref 0–149)
VLDL Cholesterol Cal: 13 mg/dL (ref 5–40)

## 2023-09-28 LAB — VITAMIN B12: Vitamin B-12: 299 pg/mL (ref 232–1245)

## 2023-09-28 LAB — HIV ANTIBODY (ROUTINE TESTING W REFLEX): HIV Screen 4th Generation wRfx: NONREACTIVE

## 2023-09-28 LAB — IRON,TIBC AND FERRITIN PANEL
Ferritin: 37 ng/mL (ref 15–150)
Iron Saturation: 18 % (ref 15–55)
Iron: 55 ug/dL (ref 27–139)
Total Iron Binding Capacity: 309 ug/dL (ref 250–450)
UIBC: 254 ug/dL (ref 118–369)

## 2023-09-28 LAB — VITAMIN D 25 HYDROXY (VIT D DEFICIENCY, FRACTURES): Vit D, 25-Hydroxy: 14.4 ng/mL — ABNORMAL LOW (ref 30.0–100.0)

## 2023-09-28 LAB — HCV AB W REFLEX TO QUANT PCR: HCV Ab: NONREACTIVE

## 2023-09-28 LAB — HEMOGLOBIN A1C
Est. average glucose Bld gHb Est-mCnc: 123 mg/dL
Hgb A1c MFr Bld: 5.9 % — ABNORMAL HIGH (ref 4.8–5.6)

## 2023-09-28 MED ORDER — VITAMIN D (ERGOCALCIFEROL) 1.25 MG (50000 UNIT) PO CAPS: 50000.0000 [IU] | ORAL_CAPSULE | ORAL | 3 refills | Status: DC

## 2023-10-08 DIAGNOSIS — J069 Acute upper respiratory infection, unspecified: Secondary | ICD-10-CM | POA: Diagnosis not present

## 2023-12-04 ENCOUNTER — Ambulatory Visit (INDEPENDENT_AMBULATORY_CARE_PROVIDER_SITE_OTHER): Admitting: Family

## 2023-12-04 ENCOUNTER — Encounter: Payer: Self-pay | Admitting: Family

## 2023-12-04 VITALS — BP 134/62 | HR 76 | Ht 63.0 in | Wt 164.0 lb

## 2023-12-04 DIAGNOSIS — K5904 Chronic idiopathic constipation: Secondary | ICD-10-CM | POA: Insufficient documentation

## 2023-12-04 DIAGNOSIS — F3160 Bipolar disorder, current episode mixed, unspecified: Secondary | ICD-10-CM

## 2023-12-04 DIAGNOSIS — Z0130 Encounter for examination of blood pressure without abnormal findings: Secondary | ICD-10-CM

## 2023-12-04 NOTE — Assessment & Plan Note (Signed)
 Patient given linzess samples.  Will reassess at follow up for efficacy.

## 2023-12-04 NOTE — Assessment & Plan Note (Signed)
 Patient stable.  Well controlled with current therapy.   Continue current meds.

## 2023-12-04 NOTE — Progress Notes (Signed)
 Acute Office Visit  Subjective:     Patient ID: Isabel Welch, female    DOB: Mar 25, 1961, 63 y.o.   MRN: [id number]  Patient is in today for  Chief Complaint  Patient presents with   Follow-up    Stomach issues    Started in April: Pain in abdomen, heartburn, stomach feels like it's "on fire".   Is having difficulty with bowel movements.  She says that if she does take something, she usually goes, but now it's taking much longer than she had previously.   She is concerned because she is having a significant increase in difficulty, and says that she frequently cannot go without taking something to help.  Has tried Miralax, exlax, docusate, enemas, increased fiber, and prune juice, none of which help.      Review of Systems  Gastrointestinal:  Positive for abdominal pain, constipation, heartburn and nausea.  All other systems reviewed and are negative.       Objective:     BP 134/62   Pulse 76   Ht 5\' 3"  (1.6 m)   Wt 164 lb (74.4 kg)   SpO2 98%   BMI 29.05 kg/m   Physical Exam Vitals and nursing note reviewed.  Constitutional:      Appearance: Normal appearance. She is normal weight.  HENT:     Head: Normocephalic.     Nose: Nose normal.  Eyes:     Extraocular Movements: Extraocular movements intact.     Conjunctiva/sclera: Conjunctivae normal.     Pupils: Pupils are equal, round, and reactive to light.  Cardiovascular:     Rate and Rhythm: Normal rate and regular rhythm.     Pulses: Normal pulses.     Heart sounds: Normal heart sounds.  Pulmonary:     Effort: Pulmonary effort is normal.     Breath sounds: Normal breath sounds.  Musculoskeletal:        General: Normal range of motion.     Cervical back: Normal range of motion.  Neurological:     General: No focal deficit present.     Mental Status: She is alert and oriented to person, place, and time. Mental status is at baseline.  Psychiatric:        Mood and Affect: Mood normal.         Behavior: Behavior normal.        Thought Content: Thought content normal.        Judgment: Judgment normal.     No results found for any visits on 12/04/23.  Recent Results (from the past 2160 hours)  Lipid panel     Status: None   Collection Time: 09/27/23 11:03 AM  Result Value Ref Range   Cholesterol, Total 129 100 - 199 mg/dL   Triglycerides 62 0 - 149 mg/dL   HDL 47 >08 mg/dL   VLDL Cholesterol Cal 13 5 - 40 mg/dL   LDL Chol Calc (NIH) 69 0 - 99 mg/dL   Chol/HDL Ratio 2.7 0.0 - 4.4 ratio    Comment:                                   T. Chol/HDL Ratio  Men  Women                               1/2 Avg.Risk  3.4    3.3                                   Avg.Risk  5.0    4.4                                2X Avg.Risk  9.6    7.1                                3X Avg.Risk 23.4   11.0   VITAMIN D  25 Hydroxy (Vit-D Deficiency, Fractures)     Status: Abnormal   Collection Time: 09/27/23 11:03 AM  Result Value Ref Range   Vit D, 25-Hydroxy 14.4 (L) 30.0 - 100.0 ng/mL    Comment: Vitamin D  deficiency has been defined by the Institute of Medicine and an Endocrine Society practice guideline as a level of serum 25-OH vitamin D  less than 20 ng/mL (1,2). The Endocrine Society went on to further define vitamin D  insufficiency as a level between 21 and 29 ng/mL (2). 1. IOM (Institute of Medicine). 2010. Dietary reference    intakes for calcium  and D. [place]: The    [employer]. 2. Holick MF, Binkley Holden, Bischoff-Ferrari HA, et al.    Evaluation, treatment, and prevention of vitamin D     deficiency: an Endocrine Society clinical practice    guideline. JCEM. 2011 Jul; 96(7):1911-30.   CMP14+EGFR     Status: Abnormal   Collection Time: 09/27/23 11:03 AM  Result Value Ref Range   Glucose 85 70 - 99 mg/dL   BUN 9 8 - 27 mg/dL   Creatinine, Ser 0.98 (H) 0.57 - 1.00 mg/dL   eGFR 58 (L) >11 BJ/YNW/2.95   BUN/Creatinine Ratio  8 (L) 12 - 28   Sodium 138 134 - 144 mmol/L   Potassium 4.5 3.5 - 5.2 mmol/L   Chloride 102 96 - 106 mmol/L   CO2 21 20 - 29 mmol/L   Calcium  9.6 8.7 - 10.3 mg/dL   Total Protein 6.9 6.0 - 8.5 g/dL   Albumin 4.5 3.9 - 4.9 g/dL   Globulin, Total 2.4 1.5 - 4.5 g/dL   Bilirubin Total 0.3 0.0 - 1.2 mg/dL   Alkaline Phosphatase 70 44 - 121 IU/L   AST 16 0 - 40 IU/L   ALT 12 0 - 32 IU/L  TSH     Status: None   Collection Time: 09/27/23 11:03 AM  Result Value Ref Range   TSH 0.593 0.450 - 4.500 uIU/mL  Hemoglobin A1c     Status: Abnormal   Collection Time: 09/27/23 11:03 AM  Result Value Ref Range   Hgb A1c MFr Bld 5.9 (H) 4.8 - 5.6 %    Comment:          Prediabetes: 5.7 - 6.4          Diabetes: >6.4          Glycemic control for adults with diabetes: <7.0    Est. average glucose Bld gHb Est-mCnc 123 mg/dL  Vitamin B12     Status: None  Collection Time: 09/27/23 11:03 AM  Result Value Ref Range   Vitamin B-12 299 232 - 1,245 pg/mL  CBC with Diff     Status: None   Collection Time: 09/27/23 11:03 AM  Result Value Ref Range   WBC 5.8 3.4 - 10.8 x10E3/uL   RBC 4.37 3.77 - 5.28 x10E6/uL   Hemoglobin 13.0 11.1 - 15.9 g/dL   Hematocrit 44.0 10.2 - 46.6 %   MCV 90 79 - 97 fL   MCH 29.7 26.6 - 33.0 pg   MCHC 33.2 31.5 - 35.7 g/dL   RDW 72.5 36.6 - 44.0 %   Platelets 304 150 - 450 x10E3/uL   Neutrophils 51 Not Estab. %   Lymphs 40 Not Estab. %   Monocytes 6 Not Estab. %   Eos 2 Not Estab. %   Basos 1 Not Estab. %   Neutrophils Absolute 3.0 1.4 - 7.0 x10E3/uL   Lymphocytes Absolute 2.3 0.7 - 3.1 x10E3/uL   Monocytes Absolute 0.4 0.1 - 0.9 x10E3/uL   EOS (ABSOLUTE) 0.1 0.0 - 0.4 x10E3/uL   Basophils Absolute 0.0 0.0 - 0.2 x10E3/uL   Immature Granulocytes 0 Not Estab. %   Immature Grans (Abs) 0.0 0.0 - 0.1 x10E3/uL  Iron, TIBC and Ferritin Panel     Status: None   Collection Time: 09/27/23 11:03 AM  Result Value Ref Range   Total Iron Binding Capacity 309 250 - 450 ug/dL    UIBC 347 425 - 956 ug/dL   Iron 55 27 - 387 ug/dL   Iron Saturation 18 15 - 55 %   Ferritin 37 15 - 150 ng/mL  Hepatitis C Ab reflex to Quant PCR     Status: None   Collection Time: 09/27/23 11:03 AM  Result Value Ref Range   HCV Ab Non Reactive Non Reactive  HIV antibody (with reflex)     Status: None   Collection Time: 09/27/23 11:03 AM  Result Value Ref Range   HIV Screen 4th Generation wRfx Non Reactive Non Reactive    Comment: HIV-1/HIV-2 antibodies and HIV-1 p24 antigen were NOT detected. There is no laboratory evidence of HIV infection. HIV Negative   Interpretation:     Status: None   Collection Time: 09/27/23 11:03 AM  Result Value Ref Range   HCV Interp 1: Comment     Comment: Not infected with HCV unless early or acute infection is suspected (which may be delayed in an immunocompromised individual), or other evidence exists to indicate HCV infection.     Allergies as of 12/04/2023   No Known Allergies      Medication List        Accurate as of Dec 04, 2023  4:24 PM. If you have any questions, ask your nurse or doctor.          STOP taking these medications    amoxicillin -clavulanate 875-125 MG tablet Commonly known as: AUGMENTIN  Stopped by: Trenda Frisk       TAKE these medications    amLODipine  2.5 MG tablet Commonly known as: NORVASC  TAKE 1 TABLET BY MOUTH DAILY   ARIPiprazole 5 MG tablet Commonly known as: ABILIFY Take 5 mg by mouth daily.   atorvastatin  10 MG tablet Commonly known as: LIPITOR TAKE 1 TABLET BY MOUTH DAILY   cyclobenzaprine  10 MG tablet Commonly known as: FLEXERIL  Take 1 tablet (10 mg total) by mouth 3 (three) times daily as needed for muscle spasms.   omeprazole  20 MG tablet Commonly known as: PriLOSEC   OTC Take 1 tablet (20 mg total) by mouth daily.   predniSONE  20 MG tablet Commonly known as: DELTASONE  Take 2 tablets (40 mg total) by mouth daily with breakfast.   traZODone 100 MG tablet Commonly known as:  DESYREL Take 100 mg by mouth at bedtime.   Vitamin D  (Ergocalciferol ) 1.25 MG (50000 UNIT) Caps capsule Commonly known as: DRISDOL  Take 1 capsule (50,000 Units total) by mouth every 7 (seven) days.            Assessment & Plan:   Problem List Items Addressed This Visit       Digestive   Chronic idiopathic constipation - Primary   Patient given linzess samples.  Will reassess at follow up for efficacy.         Other   Bipolar affective disorder, mixed (HCC)   Patient stable.  Well controlled with current therapy.   Continue current meds.         Return as previously scheduled.  Total time spent: 20 minutes  Trenda Frisk, FNP  12/04/2023   This document may have been prepared by [facility] Voice Recognition software and as such may include unintentional dictation errors.

## 2023-12-28 ENCOUNTER — Ambulatory Visit: Admitting: Family

## 2023-12-31 ENCOUNTER — Ambulatory Visit (INDEPENDENT_AMBULATORY_CARE_PROVIDER_SITE_OTHER): Admitting: Family

## 2023-12-31 ENCOUNTER — Encounter: Payer: Self-pay | Admitting: Family

## 2023-12-31 DIAGNOSIS — F3160 Bipolar disorder, current episode mixed, unspecified: Secondary | ICD-10-CM

## 2023-12-31 DIAGNOSIS — R7303 Prediabetes: Secondary | ICD-10-CM | POA: Diagnosis not present

## 2023-12-31 DIAGNOSIS — E559 Vitamin D deficiency, unspecified: Secondary | ICD-10-CM | POA: Diagnosis not present

## 2023-12-31 DIAGNOSIS — I10 Essential (primary) hypertension: Secondary | ICD-10-CM

## 2023-12-31 DIAGNOSIS — R5383 Other fatigue: Secondary | ICD-10-CM

## 2023-12-31 DIAGNOSIS — E782 Mixed hyperlipidemia: Secondary | ICD-10-CM | POA: Diagnosis not present

## 2023-12-31 DIAGNOSIS — E538 Deficiency of other specified B group vitamins: Secondary | ICD-10-CM | POA: Diagnosis not present

## 2023-12-31 NOTE — Progress Notes (Signed)
 Established Patient Office Visit  Subjective:  Patient ID: Isabel Welch, female    DOB: 03-23-1961  Age: 63 y.o. MRN: [id number]  Chief Complaint  Patient presents with   Follow-up    3 month follow up    Patient is here today for her 3 months follow up.  She has been feeling fairly well since last appointment.   She does not have additional concerns to discuss today.  She has been doing much better since her last appointment.  No longer having constipation issues.   Labs are due today. She needs refills.   I have reviewed her active problem list, medication list, allergies, notes from last encounter, lab results for her appointment today.      No other concerns at this time.   Past Medical History:  Diagnosis Date   Hypertension    Kidney stone     Past Surgical History:  Procedure Laterality Date   gallbladder stents     LEAP     TUBAL LIGATION      Social History   Socioeconomic History   Marital status: Single    Spouse name: Not on file   Number of children: Not on file   Years of education: Not on file   Highest education level: Not on file  Occupational History   Not on file  Tobacco Use   Smoking status: Never   Smokeless tobacco: Never  Vaping Use   Vaping status: Never Used  Substance and Sexual Activity   Alcohol use: No   Drug use: Never   Sexual activity: Yes  Other Topics Concern   Not on file  Social History Narrative   Not on file   Social Drivers of Health   Financial Resource Strain: Low Risk  (10/08/2023)   Received from [facility]   Overall Financial Resource Strain (CARDIA)    Difficulty of Paying Living Expenses: Not hard at all  Food Insecurity: No Food Insecurity (10/08/2023)   Received from [facility]   Hunger Vital Sign    Within the past 12 months, you worried that your food would run out before you got the money to buy more.: Never true    Within the past 12 months,  the food you bought just didn't last and you didn't have money to get more.: Never true  Transportation Needs: No Transportation Needs (10/08/2023)   Received from [facility] - Transportation    In the past 12 months, has lack of transportation kept you from medical appointments or from getting medications?: No    Lack of Transportation (Non-Medical): No  Physical Activity: Not on file  Stress: Not on file  Social Connections: Not on file  Intimate Partner Violence: Not on file    Family History  Problem Relation Age of Onset   Cancer Mother    Heart failure Father     No Known Allergies  Review of Systems  All other systems reviewed and are negative.      Objective:   BP (!) 140/88   Pulse 79   Ht 5' 3 (1.6 m)   Wt 165 lb 12.8 oz (75.2 kg)   SpO2 98%   BMI 29.37 kg/m   Vitals:   12/31/23 1504  BP: (!) 140/88  Pulse: 79  Height: 5' 3 (1.6 m)  Weight: 165 lb 12.8 oz (75.2 kg)  SpO2: 98%  BMI (Calculated): 29.38    Physical  Exam Vitals and nursing note reviewed.  Constitutional:      Appearance: Normal appearance. She is normal weight.  HENT:     Head: Normocephalic.   Eyes:     Extraocular Movements: Extraocular movements intact.     Conjunctiva/sclera: Conjunctivae normal.     Pupils: Pupils are equal, round, and reactive to light.    Cardiovascular:     Rate and Rhythm: Normal rate.  Pulmonary:     Effort: Pulmonary effort is normal.   Neurological:     General: No focal deficit present.     Mental Status: She is alert and oriented to person, place, and time. Mental status is at baseline.   Psychiatric:        Mood and Affect: Mood normal.        Behavior: Behavior normal.        Thought Content: Thought content normal.      No results found for any visits on 12/31/23.  No results found for this or any previous visit (from the past 2160 hours).     Assessment & Plan:   Assessment & Plan Bipolar  affective disorder, mixed (HCC) Patient stable.  Well controlled with current therapy.   Continue current meds.  Vitamin D  deficiency, unspecified Other fatigue B12 deficiency due to diet Checking labs today.  Will continue supplements as needed.  Prediabetes Patient educated on foods that contain carbohydrates and the need to decrease intake.  We discussed prediabetes, and what it means and the need for strict dietary control to prevent progression to type 2 diabetes.  Advised to decrease intake of sugary drinks, including sodas, sweet tea, and some juices, and of starch and sugar heavy foods (ie., potatoes, rice, bread, pasta, desserts). She verbalizes understanding and agreement with the changes discussed today.  A1C Continues to be in prediabetic ranges.  Will reassess at follow up after next lab check.  Patient counseled on dietary choices and verbalized understanding.  Mixed hyperlipidemia Checking labs today.  Continue current therapy for lipid control. Will modify as needed based on labwork results.  Essential hypertension, benign Blood pressure well controlled with current medications.  Continue current therapy.  Will reassess at follow up.    Return in about 3 months (around 04/01/2024) for F/U.   Total time spent: 20 minutes  Trenda Frisk, FNP  12/31/2023   This document may have been prepared by [facility] Voice Recognition software and as such may include unintentional dictation errors.

## 2023-12-31 NOTE — Assessment & Plan Note (Signed)
 Blood pressure well controlled with current medications.  Continue current therapy.  Will reassess at follow up.

## 2023-12-31 NOTE — Assessment & Plan Note (Signed)

## 2023-12-31 NOTE — Assessment & Plan Note (Signed)
 Checking labs today.  Will continue supplements as needed.

## 2023-12-31 NOTE — Assessment & Plan Note (Signed)
 Checking labs today.  Continue current therapy for lipid control. Will modify as needed based on labwork results.

## 2023-12-31 NOTE — Assessment & Plan Note (Signed)
 Patient stable.  Well controlled with current therapy.   Continue current meds.

## 2024-01-01 LAB — CMP14+EGFR
ALT: 14 IU/L (ref 0–32)
AST: 19 IU/L (ref 0–40)
Albumin: 4.2 g/dL (ref 3.9–4.9)
Alkaline Phosphatase: 76 IU/L (ref 44–121)
BUN/Creatinine Ratio: 18 (ref 12–28)
BUN: 15 mg/dL (ref 8–27)
Bilirubin Total: <0.2 mg/dL (ref 0.0–1.2)
CO2: 22 mmol/L (ref 20–29)
Calcium: 9.5 mg/dL (ref 8.7–10.3)
Chloride: 107 mmol/L — ABNORMAL HIGH (ref 96–106)
Creatinine, Ser: 0.84 mg/dL (ref 0.57–1.00)
Globulin, Total: 2.3 g/dL (ref 1.5–4.5)
Glucose: 89 mg/dL (ref 70–99)
Potassium: 3.7 mmol/L (ref 3.5–5.2)
Sodium: 143 mmol/L (ref 134–144)
Total Protein: 6.5 g/dL (ref 6.0–8.5)
eGFR: 79 mL/min/{1.73_m2} (ref >59)

## 2024-01-01 LAB — CBC WITH DIFFERENTIAL/PLATELET
Basophils Absolute: 0.1 10*3/uL (ref 0.0–0.2)
Basos: 1 %
EOS (ABSOLUTE): 0.2 10*3/uL (ref 0.0–0.4)
Eos: 3 %
Hematocrit: 40.1 % (ref 34.0–46.6)
Hemoglobin: 12.6 g/dL (ref 11.1–15.9)
Immature Grans (Abs): 0 10*3/uL (ref 0.0–0.1)
Immature Granulocytes: 0 %
Lymphocytes Absolute: 2.4 10*3/uL (ref 0.7–3.1)
Lymphs: 36 %
MCH: 29.3 pg (ref 26.6–33.0)
MCHC: 31.4 g/dL — ABNORMAL LOW (ref 31.5–35.7)
MCV: 93 fL (ref 79–97)
Monocytes Absolute: 0.5 10*3/uL (ref 0.1–0.9)
Monocytes: 8 %
Neutrophils Absolute: 3.6 10*3/uL (ref 1.4–7.0)
Neutrophils: 52 %
Platelets: 307 10*3/uL (ref 150–450)
RBC: 4.3 x10E6/uL (ref 3.77–5.28)
RDW: 12.4 % (ref 11.7–15.4)
WBC: 6.9 10*3/uL (ref 3.4–10.8)

## 2024-01-01 LAB — LIPID PANEL
Chol/HDL Ratio: 3 ratio (ref 0.0–4.4)
Cholesterol, Total: 161 mg/dL (ref 100–199)
HDL: 53 mg/dL (ref >39)
LDL Chol Calc (NIH): 85 mg/dL (ref 0–99)
Triglycerides: 133 mg/dL (ref 0–149)
VLDL Cholesterol Cal: 23 mg/dL (ref 5–40)

## 2024-01-01 LAB — HEMOGLOBIN A1C
Est. average glucose Bld gHb Est-mCnc: 114 mg/dL
Hgb A1c MFr Bld: 5.6 % (ref 4.8–5.6)

## 2024-01-01 LAB — VITAMIN B12: Vitamin B-12: 406 pg/mL (ref 232–1245)

## 2024-01-01 LAB — VITAMIN D 25 HYDROXY (VIT D DEFICIENCY, FRACTURES): Vit D, 25-Hydroxy: 61.1 ng/mL (ref 30.0–100.0)

## 2024-01-01 LAB — TSH: TSH: 1.33 u[IU]/mL (ref 0.450–4.500)

## 2024-01-03 ENCOUNTER — Ambulatory Visit: Payer: Self-pay

## 2024-03-04 DIAGNOSIS — I788 Other diseases of capillaries: Secondary | ICD-10-CM | POA: Diagnosis not present

## 2024-04-01 ENCOUNTER — Ambulatory Visit: Admitting: Family

## 2024-04-04 ENCOUNTER — Other Ambulatory Visit: Payer: Self-pay | Admitting: Family

## 2024-04-04 DIAGNOSIS — E782 Mixed hyperlipidemia: Secondary | ICD-10-CM

## 2024-04-04 DIAGNOSIS — I10 Essential (primary) hypertension: Secondary | ICD-10-CM

## 2024-06-23 ENCOUNTER — Encounter: Payer: Self-pay | Admitting: Family

## 2024-06-23 ENCOUNTER — Ambulatory Visit: Admitting: Family

## 2024-06-23 VITALS — BP 120/78 | HR 77 | Ht 63.0 in | Wt 175.6 lb

## 2024-06-23 DIAGNOSIS — Z1211 Encounter for screening for malignant neoplasm of colon: Secondary | ICD-10-CM

## 2024-06-23 DIAGNOSIS — Z01419 Encounter for gynecological examination (general) (routine) without abnormal findings: Secondary | ICD-10-CM

## 2024-06-23 DIAGNOSIS — E782 Mixed hyperlipidemia: Secondary | ICD-10-CM | POA: Diagnosis not present

## 2024-06-23 DIAGNOSIS — E559 Vitamin D deficiency, unspecified: Secondary | ICD-10-CM | POA: Diagnosis not present

## 2024-06-23 DIAGNOSIS — R5383 Other fatigue: Secondary | ICD-10-CM

## 2024-06-23 DIAGNOSIS — I10 Essential (primary) hypertension: Secondary | ICD-10-CM

## 2024-06-23 DIAGNOSIS — R7303 Prediabetes: Secondary | ICD-10-CM

## 2024-06-23 DIAGNOSIS — F3160 Bipolar disorder, current episode mixed, unspecified: Secondary | ICD-10-CM

## 2024-06-23 DIAGNOSIS — E538 Deficiency of other specified B group vitamins: Secondary | ICD-10-CM

## 2024-06-23 DIAGNOSIS — Z79899 Other long term (current) drug therapy: Secondary | ICD-10-CM

## 2024-06-23 NOTE — Patient Instructions (Addendum)
 1) Call to schedule your mammogram: [facility] at [facility]  [address], [address] [facility] [place],  [place]  [address]  Main: [phone]  2)  [facility] will call you to set up for colonoscopy  3) [facility] will call to set up pap.   4) Need to get your Shingles Vaccine and update your TDAP vaccine as well - talk to the pharmacy about this.  Also need Prevnar 20 vaccine for pneumonia.   Follow up in 3 months for your annual wellness visit.

## 2024-06-23 NOTE — Assessment & Plan Note (Signed)
.  A1C Continues to be in prediabetic ranges.  Will reassess at follow up after next lab check.  Patient counseled on dietary choices and verbalized understanding.   -CBC w/Diff -CMP w/eGFR -Hemoglobin A1C

## 2024-06-23 NOTE — Assessment & Plan Note (Signed)
 Blood pressure well controlled with current medications.  Continue current therapy.  Will reassess at follow up.   - CBC w/Diff - CMP w/eGFR

## 2024-06-23 NOTE — Assessment & Plan Note (Signed)
 Checking labs today.  Will continue supplements as needed.   - Vitamin D  - Vitamin B12 - TSH

## 2024-06-23 NOTE — Assessment & Plan Note (Signed)
 Checking labs today.  Continue current therapy for lipid control. Will modify as needed based on labwork results.   -CMP w/eGFR -Lipid Panel

## 2024-06-23 NOTE — Assessment & Plan Note (Signed)
Patient is seen by psych, who manage this condition.  She is well controlled with current therapy.   Will defer to them for further changes to plan of care.

## 2024-06-23 NOTE — Progress Notes (Signed)
 Established Patient Office Visit  Subjective:  Patient ID: Isabel Welch, female    DOB: [birth year]  Age: 63 y.o. MRN: [id number]  Chief Complaint  Patient presents with   Follow-up    3 month follow up    Patient is here today for her 3 months follow up.  She has been feeling fairly well since last appointment.   She does have additional concerns to discuss today.  She says that her psychiatrist has asked for labs, as well as a screen to make sure she has been taking her medications.   Labs are due today.  She needs refills.   I have reviewed her active problem list, medication list, allergies, health maintenance, notes from last encounter, lab results for her appointment today.     No other concerns at this time.   Past Medical History:  Diagnosis Date   Hypertension    Kidney stone     Past Surgical History:  Procedure Laterality Date   gallbladder stents     LEAP     TUBAL LIGATION      Social History   Socioeconomic History   Marital status: Single    Spouse name: Not on file   Number of children: Not on file   Years of education: Not on file   Highest education level: Not on file  Occupational History   Not on file  Tobacco Use   Smoking status: Never   Smokeless tobacco: Never  Vaping Use   Vaping status: Never Used  Substance and Sexual Activity   Alcohol use: No   Drug use: Never   Sexual activity: Yes  Other Topics Concern   Not on file  Social History Narrative   Not on file   Social Drivers of Health   Financial Resource Strain: Low Risk  (10/08/2023)   Received from [facility]   Overall Financial Resource Strain (CARDIA)    Difficulty of Paying Living Expenses: Not hard at all  Food Insecurity: No Food Insecurity (10/08/2023)   Received from [facility]   Hunger Vital Sign    Within the past 12 months, you worried that your food would run out before you got the money to buy more.: Never  true    Within the past 12 months, the food you bought just didn't last and you didn't have money to get more.: Never true  Transportation Needs: No Transportation Needs (10/08/2023)   Received from [facility] - Transportation    In the past 12 months, has lack of transportation kept you from medical appointments or from getting medications?: No    Lack of Transportation (Non-Medical): No  Physical Activity: Not on file  Stress: Not on file  Social Connections: Not on file  Intimate Partner Violence: Not on file    Family History  Problem Relation Age of Onset   Cancer Mother    Heart failure Father     No Known Allergies  Review of Systems  All other systems reviewed and are negative.      Objective:   BP 120/78   Pulse 77   Ht 5' 3 (1.6 m)   Wt 175 lb 9.6 oz (79.7 kg)   SpO2 97%   BMI 31.11 kg/m   Vitals:   06/23/24 0952  BP: 120/78  Pulse: 77  Height: 5' 3 (1.6 m)  Weight: 175 lb 9.6 oz (79.7 kg)  SpO2: 97%  BMI (  Calculated): 31.11    Physical Exam Vitals and nursing note reviewed.  Constitutional:      Appearance: Normal appearance. She is normal weight.  HENT:     Head: Normocephalic.  Eyes:     Extraocular Movements: Extraocular movements intact.     Conjunctiva/sclera: Conjunctivae normal.     Pupils: Pupils are equal, round, and reactive to light.  Cardiovascular:     Rate and Rhythm: Normal rate.  Pulmonary:     Effort: Pulmonary effort is normal.  Musculoskeletal:        General: Normal range of motion.  Neurological:     General: No focal deficit present.     Mental Status: She is alert and oriented to person, place, and time. Mental status is at baseline.  Psychiatric:        Mood and Affect: Mood normal.        Behavior: Behavior normal.        Thought Content: Thought content normal.        Judgment: Judgment normal.      No results found for any visits on 06/23/24.  No results found for this or  any previous visit (from the past 2160 hours).     Assessment & Plan Vitamin D  deficiency, unspecified B12 deficiency due to diet Other fatigue Checking labs today.  Will continue supplements as needed.   - Vitamin D  - Vitamin B12 - TSH  Prediabetes A1C Continues to be in prediabetic ranges.  Will reassess at follow up after next lab check.  Patient counseled on dietary choices and verbalized understanding.   -CBC w/Diff -CMP w/eGFR -Hemoglobin A1C  Mixed hyperlipidemia Checking labs today.  Continue current therapy for lipid control. Will modify as needed based on labwork results.   -CMP w/eGFR -Lipid Panel  Essential hypertension, benign Blood pressure well controlled with current medications.  Continue current therapy.  Will reassess at follow up.   - CBC w/Diff - CMP w/eGFR  Colon cancer screening Setting patient up for referral to gastroenterology  Will defer to them for further treatment changes.  Reassess at follow up.  Encounter for gynecological examination with Papanicolaou smear of cervix Setting patient up for referral to GYN .  Will defer to them for further treatment changes.  Reassess at follow up.  Long term current use of therapeutic drug Bipolar affective disorder, mixed (HCC) Patient is seen by psych, who manage this condition.  She is well controlled with current therapy.   Will defer to them for further changes to plan of care.     Return in about 3 months (around 09/21/2024) for AWV.   Total time spent: 20 minutes  ALAN CHRISTELLA ARRANT, FNP  06/23/2024   This document may have been prepared by [facility] Voice Recognition software and as such may include unintentional dictation errors.

## 2024-06-24 LAB — CMP14+EGFR
ALT: 18 IU/L (ref 0–32)
AST: 25 IU/L (ref 0–40)
Albumin: 4.4 g/dL (ref 3.9–4.9)
Alkaline Phosphatase: 83 IU/L (ref 49–135)
BUN/Creatinine Ratio: 16 (ref 12–28)
BUN: 16 mg/dL (ref 8–27)
Bilirubin Total: 0.3 mg/dL (ref 0.0–1.2)
CO2: 20 mmol/L (ref 20–29)
Calcium: 9.6 mg/dL (ref 8.7–10.3)
Chloride: 107 mmol/L — ABNORMAL HIGH (ref 96–106)
Creatinine, Ser: 0.99 mg/dL (ref 0.57–1.00)
Globulin, Total: 2.4 g/dL (ref 1.5–4.5)
Glucose: 91 mg/dL (ref 70–99)
Potassium: 4.5 mmol/L (ref 3.5–5.2)
Sodium: 143 mmol/L (ref 134–144)
Total Protein: 6.8 g/dL (ref 6.0–8.5)
eGFR: 65 mL/min/1.73 (ref >59)

## 2024-06-24 LAB — VITAMIN D 25 HYDROXY (VIT D DEFICIENCY, FRACTURES): Vit D, 25-Hydroxy: 57.4 ng/mL (ref 30.0–100.0)

## 2024-06-24 LAB — CBC WITH DIFFERENTIAL/PLATELET
Basophils Absolute: 0 x10E3/uL (ref 0.0–0.2)
Basos: 1 %
EOS (ABSOLUTE): 0.1 x10E3/uL (ref 0.0–0.4)
Eos: 2 %
Hematocrit: 40.7 % (ref 34.0–46.6)
Hemoglobin: 13.1 g/dL (ref 11.1–15.9)
Immature Grans (Abs): 0 x10E3/uL (ref 0.0–0.1)
Immature Granulocytes: 0 %
Lymphocytes Absolute: 2.4 x10E3/uL (ref 0.7–3.1)
Lymphs: 35 %
MCH: 29.8 pg (ref 26.6–33.0)
MCHC: 32.2 g/dL (ref 31.5–35.7)
MCV: 93 fL (ref 79–97)
Monocytes Absolute: 0.6 x10E3/uL (ref 0.1–0.9)
Monocytes: 8 %
Neutrophils Absolute: 3.7 x10E3/uL (ref 1.4–7.0)
Neutrophils: 54 %
Platelets: 293 x10E3/uL (ref 150–450)
RBC: 4.39 x10E6/uL (ref 3.77–5.28)
RDW: 12 % (ref 11.7–15.4)
WBC: 6.8 x10E3/uL (ref 3.4–10.8)

## 2024-06-24 LAB — LIPID PANEL
Chol/HDL Ratio: 2.6 ratio (ref 0.0–4.4)
Cholesterol, Total: 149 mg/dL (ref 100–199)
HDL: 57 mg/dL (ref >39)
LDL Chol Calc (NIH): 73 mg/dL (ref 0–99)
Triglycerides: 104 mg/dL (ref 0–149)
VLDL Cholesterol Cal: 19 mg/dL (ref 5–40)

## 2024-06-24 LAB — TSH: TSH: 1.42 u[IU]/mL (ref 0.450–4.500)

## 2024-06-24 LAB — HEMOGLOBIN A1C
Est. average glucose Bld gHb Est-mCnc: 120 mg/dL
Hgb A1c MFr Bld: 5.8 % — ABNORMAL HIGH (ref 4.8–5.6)

## 2024-06-24 LAB — VITAMIN B12: Vitamin B-12: 479 pg/mL (ref 232–1245)

## 2024-06-26 LAB — TOXASSURE SELECT,+ANTIDEPR,UR

## 2024-07-01 ENCOUNTER — Ambulatory Visit: Payer: Self-pay

## 2024-07-24 ENCOUNTER — Other Ambulatory Visit: Payer: Self-pay | Admitting: Family

## 2024-07-24 ENCOUNTER — Ambulatory Visit
Admission: RE | Admit: 2024-07-24 | Discharge: 2024-07-24 | Disposition: A | Discharge status: Home / Self Care | Source: Ambulatory Visit | Attending: Family | Admitting: Family

## 2024-07-24 DIAGNOSIS — Z1231 Encounter for screening mammogram for malignant neoplasm of breast: Secondary | ICD-10-CM | POA: Insufficient documentation

## 2024-07-24 DIAGNOSIS — R928 Other abnormal and inconclusive findings on diagnostic imaging of breast: Secondary | ICD-10-CM

## 2024-07-25 ENCOUNTER — Other Ambulatory Visit: Payer: Self-pay | Admitting: Family

## 2024-07-25 DIAGNOSIS — R928 Other abnormal and inconclusive findings on diagnostic imaging of breast: Secondary | ICD-10-CM

## 2024-07-30 ENCOUNTER — Other Ambulatory Visit: Payer: Self-pay | Admitting: Family

## 2024-07-30 ENCOUNTER — Ambulatory Visit
Admission: RE | Admit: 2024-07-30 | Discharge: 2024-07-30 | Disposition: A | Discharge status: Home / Self Care | Source: Ambulatory Visit | Attending: Family | Admitting: Family

## 2024-07-30 ENCOUNTER — Inpatient Hospital Stay: Admission: RE | Admit: 2024-07-30 | Discharge: 2024-07-30 | Attending: Family | Admitting: Family

## 2024-07-30 ENCOUNTER — Encounter: Payer: Self-pay | Admitting: Family

## 2024-07-30 DIAGNOSIS — C50411 Malignant neoplasm of upper-outer quadrant of right female breast: Secondary | ICD-10-CM | POA: Diagnosis present

## 2024-07-30 DIAGNOSIS — R928 Other abnormal and inconclusive findings on diagnostic imaging of breast: Secondary | ICD-10-CM

## 2024-07-30 HISTORY — PX: BREAST BIOPSY: SHX20

## 2024-07-30 MED ORDER — LIDOCAINE 1 % OPTIME INJ - NO CHARGE
1.0000 mL | Freq: Once | INTRAMUSCULAR | Status: AC
  Administered 2024-07-30: 1 mL
  Filled 2024-07-30: qty 2

## 2024-07-30 MED ORDER — LIDOCAINE-EPINEPHRINE 1 %-1:100000 IJ SOLN
3.0000 mL | Freq: Once | INTRAMUSCULAR | Status: AC
  Administered 2024-07-30: 3 mL
  Filled 2024-07-30: qty 10

## 2024-07-30 MED ORDER — LIDOCAINE-EPINEPHRINE 1 %-1:100000 IJ SOLN
2.0000 mL | Freq: Once | INTRAMUSCULAR | Status: AC
  Administered 2024-07-30: 2 mL
  Filled 2024-07-30: qty 10

## 2024-07-31 LAB — SURGICAL PATHOLOGY

## 2024-08-01 ENCOUNTER — Encounter: Payer: Self-pay | Admitting: *Deleted

## 2024-08-01 DIAGNOSIS — C50911 Malignant neoplasm of unspecified site of right female breast: Secondary | ICD-10-CM

## 2024-08-01 DIAGNOSIS — C50912 Malignant neoplasm of unspecified site of left female breast: Secondary | ICD-10-CM

## 2024-08-01 NOTE — Progress Notes (Signed)
 Received referral for newly diagnosed breast cancer from [facility].  Navigation initiated.  Isabel Welch will see Dr. Babara on 1/21 at 9:30.   Referral sent to [facility], their office will call with the appointment.

## 2024-08-06 ENCOUNTER — Inpatient Hospital Stay

## 2024-08-06 ENCOUNTER — Encounter: Payer: Self-pay | Admitting: *Deleted

## 2024-08-06 ENCOUNTER — Encounter: Payer: Self-pay | Admitting: Oncology

## 2024-08-06 ENCOUNTER — Inpatient Hospital Stay: Attending: Oncology | Admitting: Oncology

## 2024-08-06 VITALS — BP 121/82 | HR 75 | Temp 99.2°F | Resp 16 | Ht 64.5 in | Wt 173.7 lb

## 2024-08-06 DIAGNOSIS — Z8041 Family history of malignant neoplasm of ovary: Secondary | ICD-10-CM | POA: Diagnosis not present

## 2024-08-06 DIAGNOSIS — C50912 Malignant neoplasm of unspecified site of left female breast: Secondary | ICD-10-CM | POA: Insufficient documentation

## 2024-08-06 DIAGNOSIS — F3160 Bipolar disorder, current episode mixed, unspecified: Secondary | ICD-10-CM

## 2024-08-06 DIAGNOSIS — Z1732 Human epidermal growth factor receptor 2 negative status: Secondary | ICD-10-CM | POA: Insufficient documentation

## 2024-08-06 DIAGNOSIS — F319 Bipolar disorder, unspecified: Secondary | ICD-10-CM | POA: Insufficient documentation

## 2024-08-06 DIAGNOSIS — Z1379 Encounter for other screening for genetic and chromosomal anomalies: Secondary | ICD-10-CM

## 2024-08-06 DIAGNOSIS — Z801 Family history of malignant neoplasm of trachea, bronchus and lung: Secondary | ICD-10-CM | POA: Diagnosis not present

## 2024-08-06 DIAGNOSIS — Z1721 Progesterone receptor positive status: Secondary | ICD-10-CM | POA: Insufficient documentation

## 2024-08-06 DIAGNOSIS — Z2989 Encounter for other specified prophylactic measures: Secondary | ICD-10-CM

## 2024-08-06 DIAGNOSIS — C50911 Malignant neoplasm of unspecified site of right female breast: Secondary | ICD-10-CM

## 2024-08-06 DIAGNOSIS — Z170 Estrogen receptor positive status [ER+]: Secondary | ICD-10-CM | POA: Diagnosis not present

## 2024-08-06 DIAGNOSIS — Z800 Family history of malignant neoplasm of digestive organs: Secondary | ICD-10-CM | POA: Insufficient documentation

## 2024-08-06 LAB — CBC WITH DIFFERENTIAL/PLATELET
Abs Immature Granulocytes: 0.02 K/uL (ref 0.00–0.07)
Basophils Absolute: 0 K/uL (ref 0.0–0.1)
Basophils Relative: 1 %
Eosinophils Absolute: 0.1 K/uL (ref 0.0–0.5)
Eosinophils Relative: 2 %
HCT: 41.9 % (ref 36.0–46.0)
Hemoglobin: 13.6 g/dL (ref 12.0–15.0)
Immature Granulocytes: 0 %
Lymphocytes Relative: 34 %
Lymphs Abs: 1.9 K/uL (ref 0.7–4.0)
MCH: 29.5 pg (ref 26.0–34.0)
MCHC: 32.5 g/dL (ref 30.0–36.0)
MCV: 90.9 fL (ref 80.0–100.0)
Monocytes Absolute: 0.4 K/uL (ref 0.1–1.0)
Monocytes Relative: 8 %
Neutro Abs: 3.1 K/uL (ref 1.7–7.7)
Neutrophils Relative %: 55 %
Platelets: 287 K/uL (ref 150–400)
RBC: 4.61 MIL/uL (ref 3.87–5.11)
RDW: 13.1 % (ref 11.5–15.5)
WBC: 5.6 K/uL (ref 4.0–10.5)
nRBC: 0 % (ref 0.0–0.2)

## 2024-08-06 LAB — COMPREHENSIVE METABOLIC PANEL WITH GFR
ALT: 18 U/L (ref 0–44)
AST: 21 U/L (ref 15–41)
Albumin: 4.3 g/dL (ref 3.5–5.0)
Alkaline Phosphatase: 77 U/L (ref 38–126)
Anion gap: 9 (ref 5–15)
BUN: 18 mg/dL (ref 8–23)
CO2: 24 mmol/L (ref 22–32)
Calcium: 9.3 mg/dL (ref 8.9–10.3)
Chloride: 106 mmol/L (ref 98–111)
Creatinine, Ser: 0.91 mg/dL (ref 0.44–1.00)
GFR, Estimated: >60 mL/min
Glucose, Bld: 108 mg/dL — ABNORMAL HIGH (ref 70–99)
Potassium: 4.1 mmol/L (ref 3.5–5.1)
Sodium: 140 mmol/L (ref 135–145)
Total Bilirubin: 0.3 mg/dL (ref 0.0–1.2)
Total Protein: 7.2 g/dL (ref 6.5–8.1)

## 2024-08-06 MED ORDER — LIDOCAINE-PRILOCAINE 2.5-2.5 % EX CREA: TOPICAL_CREAM | CUTANEOUS | 3 refills | Status: AC

## 2024-08-06 MED ORDER — DEXAMETHASONE 4 MG PO TABS: ORAL_TABLET | ORAL | 1 refills | Status: AC

## 2024-08-06 MED ORDER — ONDANSETRON HCL 8 MG PO TABS: 8.0000 mg | ORAL_TABLET | Freq: Three times a day (TID) | ORAL | 1 refills | Status: AC | PRN

## 2024-08-06 MED ORDER — PROCHLORPERAZINE MALEATE 10 MG PO TABS: 10.0000 mg | ORAL_TABLET | Freq: Four times a day (QID) | ORAL | 1 refills | Status: AC | PRN

## 2024-08-06 NOTE — Progress Notes (Signed)
 New patient referred for cancer of left and right breast.

## 2024-08-06 NOTE — Assessment & Plan Note (Addendum)
 Pathology and radiology counseling: Discussed with the patient, the details of pathology including the type of breast cancer,the clinical staging, the significance of ER, PR and HER-2/neu receptors and the implications for treatment. After reviewing the pathology in detail, we proceeded to discuss the different treatment options between surgery, radiation, chemotherapy, antiestrogen therapies.  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  Treatment plan: Recommend neoadjuvant chemotherapy TCHP followed by surgery.  Adjuvant treatment plan is to be determined, based on final surgical pathology. I explained to the patient the risks and benefits of chemotherapy/HER2 targeted therapy including all but not limited to infusion reaction, hair loss,hearing loss, mouth sore, nausea, vomiting, diarrhea, low blood counts, bleeding, heart failure, kidney failure, neuropathy and risk of life threatening infection and even death, secondary malignancy etc.   Patient voices understanding and willing to proceed chemotherapy.  She she will establish care with surgery.  # Obtain MRI breast w wo contrast  # obtain baseline Echo # Check CBC, CMP, CA 27-29, CA 15.3. # Chemotherapy education; option of Medi port placement was discussed.   Antiemetics-Zofran  and Compazine ; EMLA  cream sent to pharmacy # Refer to genetic counselor  Supportive care measures are necessary for patient well-being and will be provided as necessary. We spent sufficient time to discuss many aspect of care, questions were answered to patient's satisfaction.

## 2024-08-06 NOTE — Assessment & Plan Note (Signed)
 Patient is on Abilify.   Trazodone at bedtime

## 2024-08-06 NOTE — Assessment & Plan Note (Signed)
 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  Ki-67 10% Patient will get neoadjuvant chemotherapy for left breast HER2 positive carcinoma.  After she finishes treatments, she will get bilateral lumpectomy with sentinel lymph node biopsy.

## 2024-08-06 NOTE — Progress Notes (Signed)
 [facility] Clinical Social Work  Clinical Social Work was referred by statistician for assessment of psychosocial needs.  Clinical Social Work Intern contacted patient by phone to offer support and assess for needs.   Patient denies any resource or emotional needs at this time.  Interventions: Discussed common feeling and emotions when being diagnosed with cancer, and the importance of support during treatment Informed patient of the support team roles and support services at [facility] Provided CSW contact information and encouraged patient to call with any questions or concerns Provided patient with contact information for [facility] upon request.      Follow Up Plan:  Patient will contact CSW with any support or resource needs    Thersia KATHEE Daring  Clinical Social Work Intern [facility]

## 2024-08-06 NOTE — Progress Notes (Signed)
 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 After reviewing the pathology in detail, we proceeded to discuss the different treatment options between surgery, radiation, chemotherapy, antiestrogen therapies.  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  Treatment plan: Recommend neoadjuvant chemotherapy TCHP followed by surgery.  Adjuvant treatment plan is to be determined, based on final surgical pathology. I explained to the patient the risks and benefits of chemotherapy/HER2 targeted therapy including all but not limited to infusion reaction, hair loss,hearing loss, mouth sore, nausea, vomiting, diarrhea, low blood counts, bleeding, heart failure, kidney failure, neuropathy and risk of life threatening infection and even death, secondary malignancy etc.   Patient voices understanding and willing to proceed chemotherapy.  She she will establish care with surgery.  # Obtain MRI breast w wo contrast  # obtain baseline Echo # Check CBC, CMP, CA 27-29, CA 15.3. # Chemotherapy education; option of Medi port placement was discussed.   Antiemetics-Zofran  and Compazine ; EMLA  cream sent to pharmacy # Refer to genetic counselor  Supportive care measures are necessary for patient well-being and will be provided as necessary. We spent sufficient time to discuss many aspect of care, questions were answered to patient's  satisfaction.     Invasive ductal carcinoma of breast, female, right (HCC) 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  Ki-67 10% Patient will get neoadjuvant chemotherapy for left breast HER2 positive carcinoma.  After she finishes treatments, she will get bilateral lumpectomy with sentinel lymph node biopsy.   Bipolar affective disorder, mixed (HCC) Patient is on Abilify.   Trazodone at bedtime   Orders Placed This Encounter  Procedures   MR BREAST BILATERAL W WO CONTRAST INC CAD    Standing Status:   Future    Expected Date:   08/13/2024    Expiration Date:   08/06/2025    If indicated for the ordered procedure, I authorize the administration of contrast media per Radiology protocol:   Yes    What is the patient's sedation requirement?:   No Sedation    Does the patient have a pacemaker or implanted devices?:   No    Radiology Contrast Protocol - do NOT remove file path:   [email or web address]    Preferred imaging location?:   [facility] (table limit - 500lbs)   Cancer antigen 27.29    Standing Status:   Future    Number of Occurrences:   1    Expected Date:   08/06/2024    Expiration Date:   11/04/2024   Cancer antigen 15-3    Standing Status:   Future    Number of Occurrences:   1    Expected Date:   08/06/2024    Expiration Date:   11/04/2024   Comprehensive metabolic panel with GFR    Standing Status:   Future    Number of Occurrences:   1    Expected Date:   08/06/2024    Expiration Date:  11/04/2024   CBC with Differential/Platelet    Standing Status:   Future    Number of Occurrences:   1    Expected Date:   08/06/2024    Expiration Date:   11/04/2024   Ambulatory referral to Genetics    Referral Priority:   Routine    Referral Type:   Consultation    Referral Reason:   Specialty Services Required    Number of Visits Requested:   1   ECHOCARDIOGRAM COMPLETE    Standing  Status:   Future    Expected Date:   08/13/2024    Expiration Date:   08/06/2025    Where should this test be performed:   [facility]    Perflutren DEFINITY (image enhancing agent) should be administered unless hypersensitivity or allergy exist:   Administer Perflutren    Reason for exam-Echo:   Chemo  Z09   Follow up TBD All questions were answered. The patient knows to call the clinic with any problems, questions or concerns.  Zelphia Cap, MD, PhD [facility] Hematology Oncology 08/06/2024    HISTORY OF PRESENTING ILLNESS:  Isabel Welch 64 y.o. female presents to establish care for breast cancer I have reviewed her chart and materials related to her cancer extensively and collaborated history with the patient. Summary of oncologic history is as follows: Oncology History  Invasive ductal carcinoma of breast, female, left (HCC)  07/24/2024 Mammogram   Bilateral screening mammogram showed possible mass in the right breast and possible asymmetries and distortion in the left breast.   07/30/2024 Mammogram   Bilateral diagnostic mammogram showed 1. There is a highly suspicious 10 mm RIGHT breast mass. Recommend ultrasound-guided biopsy for definitive characterization. 2. No suspicious RIGHT axillary adenopathy. 3. There are 2 suspicious LEFT breast masses which span approximately 5 cm mammographically in total. Recommend 2 site ultrasound-guided biopsy for definitive characterization. 4. No suspicious LEFT axillary adenopathy.   08/06/2024 Initial Diagnosis   invasive ductal carcinoma of breast   Patient underwent bilateral breast biopsy Pathology showed  1. Breast, left, needle core biopsy, 3:00 2cmfn (coil clip) INVASIVE POORLY DIFFERENTIATED DUCTAL ADENOCARCINOMA, GRADE 3 (3+2+3) FOCAL DUCTAL CARCINOMA IN SITU, INTERMEDIATE NUCLEAR GRADE, SOLID TYPE WITHOUT NECROSIS TUBULE FORMATION: SCORE 3 NUCLEAR PLEOMORPHISM: SCORE 2 MITOTIC COUNT: SCORE 3 TOTAL SCORE: 8 OVERALL GRADE:  GRADE 3 (8/9) NEGATIVE FOR ANGIOLYMPHATIC INVASION TUMOR MEASURES 5 MM IN GREATEST LINEAR EXTENT   ER +5%, PR negative, HER2 + (3+)  2. Breast, left, needle core biopsy, 4:00 4cmfn (ribbon clip) INVASIVE POORLY DIFFERENTIATED DUCTAL ADENOCARCINOMA, GRADE 3 (3+2+3) TUBULE FORMATION: SCORE 3 NUCLEAR PLEOMORPHISM: SCORE 2 MITOTIC COUNT: SCORE 3 TOTAL SCORE: 8 OVERALL GRADE: GRADE 3 (8/9) NEGATIVE FOR ANGIOLYMPHATIC INVASION TUMOR MEASURES 6 MM IN GREATEST LINEAR EXTENT   3. Breast, left, needle core biopsy, 3:00 1 cmfn (venus clip) INVASIVE POORLY DIFFERENTIATED DUCTAL ADENOCARCINOMA, GRADE 3 (3+2+3) FOCAL DUCTAL CARCINOMA IN SITU, INTERMEDIATE NUCLEAR GRADE, SOLID TYPE WITHOUT NECROSIS TUBULE FORMATION: SCORE 3 NUCLEAR PLEOMORPHISM: SCORE 2 MITOTIC COUNT: SCORE 3 TOTAL SCORE: 8 OVERALL GRADE: GRADE 3 (8/9) NEGATIVE FOR ANGIOLYMPHATIC INVASION TUMOR MEASURES 2 MM IN GREATEST LINEAR EXTENT  4. Breast, right, needle core biopsy, 10:00 9cmfn (heart clip) 1 of 4 FINAL for Barabas, Isabel Welch ([id number]) Diagnosis(continued) INVASIVE MODERATELY DIFFERENTIATED DUCTAL ADENOCARCINOMA, GRADE 2 (3+2+1) TUBULE FORMATION: SCORE 3 NUCLEAR PLEOMORPHISM: SCORE 2 MITOTIC COUNT: SCORE 1 TOTAL SCORE: 6 OVERALL GRADE: GRADE 2 (6/9) NEGATIVE FOR ANGIOLYMPHATIC INVASION MICROCALCIFICATIONS PRESENT WITHIN INVASIVE TUMOR TUMOR MEASURES  A 6.5 MM IN GREATEST LINEAR EXTENT  ER +100%, PR +85%, HER2 negative (0+)   Menarche at age of 89 First live birth at age of 68 OCP use: Less than 5 years of usage. History of hysterectomy: No Menopausal status: Postmenopausal at age 7, History of HRT use: No History of chest radiation: No Number of previous breast biopsies: No Denies family history of breast cancer.   08/06/2024 Cancer Staging   Staging form: Breast, AJCC 8th Edition - Clinical stage from 08/06/2024: Stage IIB (cT3, cN0, cM0, G3, ER+, PR-, HER2+) - Signed by Babara Call, MD on 08/06/2024 Stage prefix: Initial  diagnosis Histologic grading system: 3 grade system   Invasive ductal carcinoma of breast, female, right (HCC)  07/30/2024 Mammogram   Bilateral diagnostic mammogram showed 1. There is a highly suspicious 10 mm RIGHT breast mass. Recommend ultrasound-guided biopsy for definitive characterization. 2. No suspicious RIGHT axillary adenopathy. 3. There are 2 suspicious LEFT breast masses which span approximately 5 cm mammographically in total. Recommend 2 site ultrasound-guided biopsy for definitive characterization. 4. No suspicious LEFT axillary adenopathy.   08/06/2024 Initial Diagnosis   Invasive ductal carcinoma of breast, female, right (HCC)   08/06/2024 Cancer Staging   Staging form: Breast, AJCC 8th Edition - Clinical stage from 08/06/2024: Stage IA (cT1b, cN0, cM0, G2, ER+, PR+, HER2-) - Signed by Babara Call, MD on 08/06/2024 Stage prefix: Initial diagnosis Histologic grading system: 3 grade system    Patient presents to establish care and discuss plans. She denies any breast concerns except some breast soreness after recent biopsy bilaterally.   She expresses feeling overwhelmed by the diagnosis and the prospect of treatment, stating that no one wants to hear that word cancer, especially given her healthy lifestyle. Her daughter was present for support during the visit.  There is no family history of breast cancer, but there is a history of stomach and lung cancer in the family. Genetic testing was recommended due to HER2-positive status and family history of other malignancies.   MEDICAL HISTORY:  Past Medical History:  Diagnosis Date   Hypertension    Kidney stone     SURGICAL HISTORY: Past Surgical History:  Procedure Laterality Date   BREAST BIOPSY Right 07/30/2024   u/s bx path pending   BREAST BIOPSY Left 07/30/2024   u/s bx x3   BREAST BIOPSY Right 07/30/2024   US  RT BREAST BX W LOC DEV 1ST LESION IMG BX SPEC US  GUIDE 07/30/2024 [facility]   BREAST BIOPSY  Left 07/30/2024   US  LT BREAST BX W LOC DEV EA ADD LESION IMG BX SPEC US  GUIDE 07/30/2024 [facility]   BREAST BIOPSY Left 07/30/2024   US  LT BREAST BX W LOC DEV 1ST LESION IMG BX SPEC US  GUIDE 07/30/2024 [facility]   BREAST BIOPSY Left 07/30/2024   US  LT BREAST BX W LOC DEV EA ADD LESION IMG BX SPEC US  GUIDE 07/30/2024 [facility]   gallbladder stents     LEAP     TUBAL LIGATION      SOCIAL HISTORY: Social History   Socioeconomic History   Marital status: Single    Spouse name: Not on file   Number of children: Not on file   Years of education: Not on file   Highest education level: Not on file  Occupational History   Not on file  Tobacco Use   Smoking status: Never   Smokeless tobacco: Never  Vaping Use   Vaping status: Never Used  Substance and Sexual Activity  Alcohol use: Not Currently    Comment: on occasion   Drug use: Never   Sexual activity: Yes  Other Topics Concern   Not on file  Social History Narrative   Not on file   Social Drivers of Health   Tobacco Use: Low Risk (08/06/2024)   Patient History    Smoking Tobacco Use: Never    Smokeless Tobacco Use: Never    Passive Exposure: Not on file  Financial Resource Strain: Low Risk (08/06/2024)   Overall Financial Resource Strain (CARDIA)    Difficulty of Paying Living Expenses: Not very hard  Food Insecurity: No Food Insecurity (08/06/2024)   Epic    Worried About Radiation Protection Practitioner of Food in the Last Year: Never true    Ran Out of Food in the Last Year: Never true  Transportation Needs: No Transportation Needs (08/06/2024)   Epic    Lack of Transportation (Medical): No    Lack of Transportation (Non-Medical): No  Physical Activity: Not on file  Stress: Not on file  Social Connections: Not on file  Intimate Partner Violence: Not At Risk (08/06/2024)   Epic    Fear of Current or Ex-Partner: No    Emotionally Abused: No    Physically Abused: No    Sexually Abused: No  Depression (PHQ2-9): Low Risk  (08/06/2024)   Depression (PHQ2-9)    PHQ-2 Score: 0  Alcohol Screen: Low Risk (08/06/2024)   Alcohol Screen    Last Alcohol Screening Score (AUDIT): 1  Housing: Unknown (08/06/2024)   Epic    Unable to Pay for Housing in the Last Year: No    Number of Times Moved in the Last Year: Not on file    Homeless in the Last Year: Not on file  Recent Concern: Housing - High Risk (08/06/2024)   Epic    Unable to Pay for Housing in the Last Year: Yes    Number of Times Moved in the Last Year: 0    Homeless in the Last Year: No  Utilities: Not At Risk (08/06/2024)   Epic    Threatened with loss of utilities: No  Health Literacy: Not on file    FAMILY HISTORY: Family History  Problem Relation Age of Onset   Cancer Mother    Lung cancer Mother    Heart failure Father    Lung cancer Maternal Aunt    Ovarian cancer Maternal Aunt    Stomach cancer Maternal Aunt     ALLERGIES:  has no known allergies.  MEDICATIONS:  Current Outpatient Medications  Medication Sig Dispense Refill   amLODipine  (NORVASC ) 2.5 MG tablet TAKE 1 TABLET BY MOUTH DAILY 100 tablet 2   ARIPiprazole (ABILIFY) 5 MG tablet Take 5 mg by mouth daily.  3   atorvastatin  (LIPITOR) 10 MG tablet TAKE 1 TABLET BY MOUTH DAILY 100 tablet 2   cyclobenzaprine  (FLEXERIL ) 10 MG tablet Take 1 tablet (10 mg total) by mouth 3 (three) times daily as needed for muscle spasms. 20 tablet 0   omeprazole  (PRILOSEC  OTC) 20 MG tablet Take 1 tablet (20 mg total) by mouth daily. 28 tablet 1   traZODone (DESYREL) 100 MG tablet Take 100 mg by mouth at bedtime.      No current facility-administered medications for this visit.    Review of Systems  Constitutional:  Negative for appetite change, chills, fatigue and fever.  HENT:   Negative for hearing loss and voice change.   Eyes:  Negative for eye problems.  Respiratory:  Negative for chest tightness and cough.   Cardiovascular:  Negative for chest pain.  Gastrointestinal:  Negative for  abdominal distention, abdominal pain and blood in stool.  Endocrine: Negative for hot flashes.  Genitourinary:  Negative for difficulty urinating and frequency.   Musculoskeletal:  Negative for arthralgias.  Skin:  Negative for itching and rash.  Neurological:  Negative for extremity weakness.  Hematological:  Negative for adenopathy.  Psychiatric/Behavioral:  Negative for confusion. The patient is nervous/anxious.      PHYSICAL EXAMINATION: ECOG PERFORMANCE STATUS: 0 - Asymptomatic  Vitals:   08/06/24 0954  BP: 121/82  Pulse: 75  Resp: 16  Temp: 99.2 F (37.3 C)  SpO2: 100%   Filed Weights   08/06/24 0954  Weight: 173 lb 11.2 oz (78.8 kg)    Physical Exam Constitutional:      General: She is not in acute distress.    Appearance: She is not diaphoretic.  HENT:     Head: Normocephalic and atraumatic.  Eyes:     General: No scleral icterus. Cardiovascular:     Rate and Rhythm: Normal rate and regular rhythm.  Pulmonary:     Effort: Pulmonary effort is normal. No respiratory distress.     Breath sounds: No wheezing.  Abdominal:     General: There is no distension.     Palpations: Abdomen is soft.     Tenderness: There is no abdominal tenderness.  Musculoskeletal:        General: Normal range of motion.     Cervical back: Normal range of motion and neck supple.  Skin:    General: Skin is warm and dry.     Findings: No erythema.  Neurological:     Mental Status: She is alert and oriented to person, place, and time. Mental status is at baseline.     Motor: No abnormal muscle tone.  Psychiatric:        Mood and Affect: Mood and affect normal.   Breast exam was performed in seated and lying down position. Patient is status post bilateral breast biopsy with focal bruising.   No palpable mass in the right breast.   Left breast 3:00 palpable breast mass. No palpable axillary adenopathy bilaterally.   LABORATORY DATA:  I have reviewed the data as listed    Latest  Ref Rng & Units 08/06/2024   10:47 AM 06/23/2024   10:59 AM 12/31/2023    3:34 PM  CBC  WBC 4.0 - 10.5 K/uL 5.6  6.8  6.9   Hemoglobin 12.0 - 15.0 g/dL 86.3  86.8  87.3   Hematocrit 36.0 - 46.0 % 41.9  40.7  40.1   Platelets 150 - 400 K/uL 287  293  307       Latest Ref Rng & Units 08/06/2024   10:47 AM 06/23/2024   10:59 AM 12/31/2023    3:34 PM  CMP  Glucose 70 - 99 mg/dL 891  91  89   BUN 8 - 23 mg/dL 18  16  15    Creatinine 0.44 - 1.00 mg/dL 9.08  9.00  9.15   Sodium 135 - 145 mmol/L 140  143  143   Potassium 3.5 - 5.1 mmol/L 4.1  4.5  3.7   Chloride 98 - 111 mmol/L 106  107  107   CO2 22 - 32 mmol/L 24  20  22    Calcium  8.9 - 10.3 mg/dL 9.3  9.6  9.5   Total Protein 6.5 - 8.1 g/dL 7.2  6.8  6.5   Total Bilirubin 0.0 - 1.2 mg/dL 0.3  0.3  <9.7   Alkaline Phos 38 - 126 U/L 77  83  76   AST 15 - 41 U/L 21  25  19    ALT 0 - 44 U/L 18  18  14       RADIOGRAPHIC STUDIES: I have personally reviewed the radiological images as listed and agreed with the findings in the report. US  LT BREAST BX W LOC DEV 1ST LESION IMG BX SPEC US  GUIDE Addendum Date: 08/04/2024 ADDENDUM REPORT: 08/04/2024 12:15 ADDENDUM: PATHOLOGY revealed: Site 1. 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 Pathology results are CONCORDANT with imaging findings, per Dr. Corean Salter. PATHOLOGY revealed: Site 2. Breast, left, needle core biopsy, 4:00 4 cmfn (ribbon clip)- INVASIVE POORLY DIFFERENTIATED DUCTAL ADENOCARCINOMA, GRADE 3; OVERALL GRADE: GRADE 3; NEGATIVE FOR ANGIOLYMPHATIC INVASION; TUMOR MEASURES 6 MM IN GREATEST LINEAR EXTENT. Pathology results are CONCORDANT with imaging findings, per Dr. Corean Salter. PATHOLOGY revealed: Site 3. Breast,  left, needle core biopsy, 3:00 1 cmfn (venus clip)- INVASIVE POORLY DIFFERENTIATED DUCTAL ADENOCARCINOMA, GRADE 3; FOCAL DUCTAL CARCINOMA IN SITU, INTERMEDIATE NUCLEAR GRADE, SOLID TYPE WITHOUT NECROSIS; OVERALL GRADE: GRADE 3; NEGATIVE FOR ANGIOLYMPHATIC INVASION; TUMOR MEASURES 2 MM IN GREATEST LINEAR EXTENT. Pathology results are CONCORDANT with imaging findings, per Dr. Corean Salter. PATHOLOGY revealed: Site 4. Breast, right, needle core biopsy, 10:00 9 cmfn (heart clip)- INVASIVE MODERATELY DIFFERENTIATED DUCTAL ADENOCARCINOMA, GRADE 2; OVERALL GRADE: 2; NEGATIVE FOR ANGIOLYMPHATIC INVASION; MICROCALCIFICATIONS PRESENT WITHIN INVASIVE TUMOR; TUMOR MEASURES A 6.5 MM IN GREATEST LINEAR EXTENT. Pathology results are CONCORDANT with imaging findings, per Dr. Corean Salter. Pathology results and recommendations below were discussed with patient by telephone. Patient reported biopsy site within normal limits with slight tenderness at the site. Post biopsy care instructions were reviewed, questions were answered and my direct phone number was provided to patient. Patient was instructed to call [facility] if any concerns or questions arise related to the biopsy. RECOMMENDATIONS: 1. Surgical and oncological consultation. Request for surgical and oncological consultation was relayed to Shasta Ada, Nurse Navigator at [facility]. 2. Recommend pretreatment bilateral breast MRI with and without contrast to determine extent of breast disease. 3.  Clips are at least 5 cm apart on the left Pathology results reported by Mliss CHARM Molt RN 08/01/2024. Electronically Signed   By: Corean Salter M.D.   On: 08/04/2024 12:15   Result Date: 08/04/2024 CLINICAL DATA:  Suspicious bilateral breast masses EXAM: ULTRASOUND GUIDED RIGHT BREAST CORE NEEDLE BIOPSY ULTRASOUND GUIDED LEFT BREAST CORE NEEDLE BIOPSY x 3 COMPARISON:  Previous exam(s). PROCEDURE: I met with the patient and we discussed  the procedure of ultrasound-guided biopsy, including benefits and alternatives. We discussed the high likelihood of a successful procedure. We discussed the risks of the procedure, including infection, bleeding, tissue injury, clip migration, and inadequate sampling. Informed written consent was given. The usual time-out protocol was performed immediately prior to the procedure. On setup for patient, a heterogeneous non mass area in the LEFT outer breast in the immediate retroareolar region was identified. This is estimated to span at least 13 x 7 x 14 mm. This is favored to correspond to the focal asymmetry noted at anterior depth. This was discussed with  patient and decision to proceed with 3 site LEFT breast ultrasound-guided biopsy was discussed with patient. She expressed understanding and agreed. Site 1: LEFT breast 3 o'clock 2 cm from nipple Lesion quadrant: Lower outer quadrant Using sterile technique and 1% lidocaine  and 1% lidocaine  with epinephrine  as local anesthetic, under direct ultrasound visualization, a 14 gauge spring-loaded device was used to perform biopsy of a mass at 3 o'clock 2 cm the nipple using a lateral approach. At the conclusion of the procedure a COIL-shaped tissue marker clip was deployed into the biopsy cavity. Marker could not be well visualized upon deployment due to overlapping gas. Site 2: LEFT breast 4 o'clock 4 cm from the nipple Lesion quadrant: Lower outer quadrant Using sterile technique and 1% lidocaine  and 1% lidocaine  with epinephrine  as local anesthetic, under direct ultrasound visualization, a 14 gauge spring-loaded device was used to perform biopsy of a mass at 4 o'clock 4 cm from the nipple using a lateral approach. At the conclusion of the procedure a RIBBON-shaped tissue marker clip was deployed into the biopsy cavity. Site 3: LEFT breast 3 o'clock 1 cm from the nipple Lesion quadrant: Lower outer quadrant Using sterile technique and 1% lidocaine  and 1% lidocaine   with epinephrine  as local anesthetic, under direct ultrasound visualization, a 14 gauge spring-loaded device was used to perform biopsy of an area at 3 o'clock 1 cm from the nipple using a inferolateral approach. At the conclusion of the procedure a venus-shaped tissue marker clip was deployed into the biopsy cavity. Site 4: RIGHT breast 10 o'clock 9 cm from the nipple Lesion quadrant: Upper outer quadrant Using sterile technique and 1% lidocaine  and 1% lidocaine  with epinephrine  as local anesthetic, under direct ultrasound visualization, a 14 gauge spring-loaded device was used to perform biopsy of a mass at 10 o'clock 9 cm from nipple using a inferolateral approach. At the conclusion of the procedure a heart-shaped tissue marker clip was deployed into the biopsy cavity. Follow up 2 view mammogram was performed and dictated separately. IMPRESSION: 1. During setup for ultrasound-guided biopsy, a 14 mm non mass area at 3 o'clock 1 cm from the nipple in the LEFT breast was identified. This is favored to correspond to the anterior depth focal asymmetry. Ultrasound-guided biopsy was subsequently performed on the site (site 3). 2. Status post ultrasound-guided biopsy of 3 LEFT breast masses and 1 RIGHT breast mass. No apparent complication. Electronically Signed: By: Corean Salter M.D. On: 07/30/2024 11:57   US  LT BREAST BX W LOC DEV EA ADD LESION IMG BX SPEC US  GUIDE Addendum Date: 08/04/2024 ADDENDUM REPORT: 08/04/2024 12:15 ADDENDUM: PATHOLOGY revealed: Site 1. 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 Pathology results are CONCORDANT with imaging findings, per Dr. Corean Salter. PATHOLOGY revealed: Site 2. Breast, left, needle core  biopsy, 4:00 4 cmfn (ribbon clip)- INVASIVE POORLY DIFFERENTIATED DUCTAL ADENOCARCINOMA, GRADE 3; OVERALL GRADE: GRADE 3; NEGATIVE FOR ANGIOLYMPHATIC INVASION; TUMOR MEASURES 6 MM IN GREATEST LINEAR EXTENT. Pathology results are CONCORDANT with imaging findings, per Dr. Corean Salter. PATHOLOGY revealed: Site 3. Breast, left, needle core biopsy, 3:00 1 cmfn (venus clip)- INVASIVE POORLY DIFFERENTIATED DUCTAL ADENOCARCINOMA, GRADE 3; FOCAL DUCTAL CARCINOMA IN SITU, INTERMEDIATE NUCLEAR GRADE, SOLID TYPE WITHOUT NECROSIS; OVERALL GRADE: GRADE 3; NEGATIVE FOR ANGIOLYMPHATIC INVASION; TUMOR MEASURES 2 MM IN  GREATEST LINEAR EXTENT. Pathology results are CONCORDANT with imaging findings, per Dr. Corean Salter. PATHOLOGY revealed: Site 4. Breast, right, needle core biopsy, 10:00 9 cmfn (heart clip)- INVASIVE MODERATELY DIFFERENTIATED DUCTAL ADENOCARCINOMA, GRADE 2; OVERALL GRADE: 2; NEGATIVE FOR ANGIOLYMPHATIC INVASION; MICROCALCIFICATIONS PRESENT WITHIN INVASIVE TUMOR; TUMOR MEASURES A 6.5 MM IN GREATEST LINEAR EXTENT. Pathology results are CONCORDANT with imaging findings, per Dr. Corean Salter. Pathology results and recommendations below were discussed with patient by telephone. Patient reported biopsy site within normal limits with slight tenderness at the site. Post biopsy care instructions were reviewed, questions were answered and my direct phone number was provided to patient. Patient was instructed to call [facility] if any concerns or questions arise related to the biopsy. RECOMMENDATIONS: 1. Surgical and oncological consultation. Request for surgical and oncological consultation was relayed to Shasta Ada, Nurse Navigator at [facility]. 2. Recommend pretreatment bilateral breast MRI with and without contrast to determine extent of breast disease. 3.  Clips are at least 5 cm apart on the left Pathology results reported by Mliss CHARM Molt RN 08/01/2024. Electronically  Signed   By: Corean Salter M.D.   On: 08/04/2024 12:15   Result Date: 08/04/2024 CLINICAL DATA:  Suspicious bilateral breast masses EXAM: ULTRASOUND GUIDED RIGHT BREAST CORE NEEDLE BIOPSY ULTRASOUND GUIDED LEFT BREAST CORE NEEDLE BIOPSY x 3 COMPARISON:  Previous exam(s). PROCEDURE: I met with the patient and we discussed the procedure of ultrasound-guided biopsy, including benefits and alternatives. We discussed the high likelihood of a successful procedure. We discussed the risks of the procedure, including infection, bleeding, tissue injury, clip migration, and inadequate sampling. Informed written consent was given. The usual time-out protocol was performed immediately prior to the procedure. On setup for patient, a heterogeneous non mass area in the LEFT outer breast in the immediate retroareolar region was identified. This is estimated to span at least 13 x 7 x 14 mm. This is favored to correspond to the focal asymmetry noted at anterior depth. This was discussed with patient and decision to proceed with 3 site LEFT breast ultrasound-guided biopsy was discussed with patient. She expressed understanding and agreed. Site 1: LEFT breast 3 o'clock 2 cm from nipple Lesion quadrant: Lower outer quadrant Using sterile technique and 1% lidocaine  and 1% lidocaine  with epinephrine  as local anesthetic, under direct ultrasound visualization, a 14 gauge spring-loaded device was used to perform biopsy of a mass at 3 o'clock 2 cm the nipple using a lateral approach. At the conclusion of the procedure a COIL-shaped tissue marker clip was deployed into the biopsy cavity. Marker could not be well visualized upon deployment due to overlapping gas. Site 2: LEFT breast 4 o'clock 4 cm from the nipple Lesion quadrant: Lower outer quadrant Using sterile technique and 1% lidocaine  and 1% lidocaine  with epinephrine  as local anesthetic, under direct ultrasound visualization, a 14 gauge spring-loaded device was used to perform  biopsy of a mass at 4 o'clock 4 cm from the nipple using a lateral approach. At the conclusion of the procedure a RIBBON-shaped tissue marker clip was deployed into the biopsy cavity. Site 3: LEFT breast 3 o'clock 1 cm from the nipple Lesion quadrant: Lower outer quadrant Using sterile technique and 1% lidocaine  and 1% lidocaine  with epinephrine  as local anesthetic, under direct ultrasound visualization, a 14 gauge spring-loaded device was used to perform biopsy of an area at 3 o'clock 1 cm from the nipple using a inferolateral approach. At the conclusion of the procedure a venus-shaped tissue marker  clip was deployed into the biopsy cavity. Site 4: RIGHT breast 10 o'clock 9 cm from the nipple Lesion quadrant: Upper outer quadrant Using sterile technique and 1% lidocaine  and 1% lidocaine  with epinephrine  as local anesthetic, under direct ultrasound visualization, a 14 gauge spring-loaded device was used to perform biopsy of a mass at 10 o'clock 9 cm from nipple using a inferolateral approach. At the conclusion of the procedure a heart-shaped tissue marker clip was deployed into the biopsy cavity. Follow up 2 view mammogram was performed and dictated separately. IMPRESSION: 1. During setup for ultrasound-guided biopsy, a 14 mm non mass area at 3 o'clock 1 cm from the nipple in the LEFT breast was identified. This is favored to correspond to the anterior depth focal asymmetry. Ultrasound-guided biopsy was subsequently performed on the site (site 3). 2. Status post ultrasound-guided biopsy of 3 LEFT breast masses and 1 RIGHT breast mass. No apparent complication. Electronically Signed: By: Corean Salter M.D. On: 07/30/2024 11:57   US  RT BREAST BX W LOC DEV 1ST LESION IMG BX SPEC US  GUIDE Addendum Date: 08/04/2024 ADDENDUM REPORT: 08/04/2024 12:15 ADDENDUM: PATHOLOGY revealed: Site 1. 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 Pathology results are CONCORDANT with imaging findings, per Dr. Corean Salter. PATHOLOGY revealed: Site 2. Breast, left, needle core biopsy, 4:00 4 cmfn (ribbon clip)- INVASIVE POORLY DIFFERENTIATED DUCTAL ADENOCARCINOMA, GRADE 3; OVERALL GRADE: GRADE 3; NEGATIVE FOR ANGIOLYMPHATIC INVASION; TUMOR MEASURES 6 MM IN GREATEST LINEAR EXTENT. Pathology results are CONCORDANT with imaging findings, per Dr. Corean Salter. PATHOLOGY revealed: Site 3. 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 Pathology results are CONCORDANT with imaging findings, per Dr. Corean Salter. PATHOLOGY revealed: Site 4. Breast, right, needle core biopsy, 10:00 9 cmfn (heart clip)- INVASIVE MODERATELY DIFFERENTIATED DUCTAL ADENOCARCINOMA, GRADE 2; OVERALL GRADE: 2; NEGATIVE FOR ANGIOLYMPHATIC INVASION; MICROCALCIFICATIONS PRESENT WITHIN INVASIVE TUMOR; TUMOR MEASURES A 6.5 MM IN GREATEST LINEAR EXTENT. Pathology results are CONCORDANT with imaging findings, per Dr. Corean Salter. Pathology results and recommendations below were discussed with patient by telephone. Patient reported biopsy site within normal limits with slight tenderness at the site. Post biopsy care instructions were reviewed, questions were answered and my direct phone number was provided to patient. Patient was instructed to call [facility] if any concerns or questions arise related to the biopsy. RECOMMENDATIONS: 1. Surgical and oncological consultation. Request for surgical and oncological  consultation was relayed to Shasta Ada, Nurse Navigator at [facility]. 2. Recommend pretreatment bilateral breast MRI with and without contrast to determine extent of breast disease. 3.  Clips are at least 5 cm apart on the left Pathology results reported by Mliss CHARM Molt RN 08/01/2024. Electronically Signed   By: Corean Salter M.D.   On: 08/04/2024 12:15   Result Date: 08/04/2024 CLINICAL DATA:  Suspicious bilateral breast masses EXAM: ULTRASOUND GUIDED RIGHT BREAST CORE NEEDLE BIOPSY ULTRASOUND GUIDED LEFT BREAST CORE NEEDLE BIOPSY x 3 COMPARISON:  Previous exam(s). PROCEDURE: I met with the patient and we discussed the procedure of ultrasound-guided biopsy, including benefits and alternatives.  We discussed the high likelihood of a successful procedure. We discussed the risks of the procedure, including infection, bleeding, tissue injury, clip migration, and inadequate sampling. Informed written consent was given. The usual time-out protocol was performed immediately prior to the procedure. On setup for patient, a heterogeneous non mass area in the LEFT outer breast in the immediate retroareolar region was identified. This is estimated to span at least 13 x 7 x 14 mm. This is favored to correspond to the focal asymmetry noted at anterior depth. This was discussed with patient and decision to proceed with 3 site LEFT breast ultrasound-guided biopsy was discussed with patient. She expressed understanding and agreed. Site 1: LEFT breast 3 o'clock 2 cm from nipple Lesion quadrant: Lower outer quadrant Using sterile technique and 1% lidocaine  and 1% lidocaine  with epinephrine  as local anesthetic, under direct ultrasound visualization, a 14 gauge spring-loaded device was used to perform biopsy of a mass at 3 o'clock 2 cm the nipple using a lateral approach. At the conclusion of the procedure a COIL-shaped tissue marker clip was deployed into the biopsy cavity. Marker could not be well  visualized upon deployment due to overlapping gas. Site 2: LEFT breast 4 o'clock 4 cm from the nipple Lesion quadrant: Lower outer quadrant Using sterile technique and 1% lidocaine  and 1% lidocaine  with epinephrine  as local anesthetic, under direct ultrasound visualization, a 14 gauge spring-loaded device was used to perform biopsy of a mass at 4 o'clock 4 cm from the nipple using a lateral approach. At the conclusion of the procedure a RIBBON-shaped tissue marker clip was deployed into the biopsy cavity. Site 3: LEFT breast 3 o'clock 1 cm from the nipple Lesion quadrant: Lower outer quadrant Using sterile technique and 1% lidocaine  and 1% lidocaine  with epinephrine  as local anesthetic, under direct ultrasound visualization, a 14 gauge spring-loaded device was used to perform biopsy of an area at 3 o'clock 1 cm from the nipple using a inferolateral approach. At the conclusion of the procedure a venus-shaped tissue marker clip was deployed into the biopsy cavity. Site 4: RIGHT breast 10 o'clock 9 cm from the nipple Lesion quadrant: Upper outer quadrant Using sterile technique and 1% lidocaine  and 1% lidocaine  with epinephrine  as local anesthetic, under direct ultrasound visualization, a 14 gauge spring-loaded device was used to perform biopsy of a mass at 10 o'clock 9 cm from nipple using a inferolateral approach. At the conclusion of the procedure a heart-shaped tissue marker clip was deployed into the biopsy cavity. Follow up 2 view mammogram was performed and dictated separately. IMPRESSION: 1. During setup for ultrasound-guided biopsy, a 14 mm non mass area at 3 o'clock 1 cm from the nipple in the LEFT breast was identified. This is favored to correspond to the anterior depth focal asymmetry. Ultrasound-guided biopsy was subsequently performed on the site (site 3). 2. Status post ultrasound-guided biopsy of 3 LEFT breast masses and 1 RIGHT breast mass. No apparent complication. Electronically Signed: By:  Corean Salter M.D. On: 07/30/2024 11:57   US  LT BREAST BX W LOC DEV EA ADD LESION IMG BX SPEC US  GUIDE Addendum Date: 08/04/2024 ADDENDUM REPORT: 08/04/2024 12:15 ADDENDUM: PATHOLOGY revealed: Site 1. 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 Pathology results are CONCORDANT with imaging findings, per Dr. Corean Salter. PATHOLOGY  revealed: Site 2. Breast, left, needle core biopsy, 4:00 4 cmfn (ribbon clip)- INVASIVE POORLY DIFFERENTIATED DUCTAL ADENOCARCINOMA, GRADE 3; OVERALL GRADE: GRADE 3; NEGATIVE FOR ANGIOLYMPHATIC INVASION; TUMOR MEASURES 6 MM IN GREATEST LINEAR EXTENT. Pathology results are CONCORDANT with imaging findings, per Dr. Corean Salter. PATHOLOGY revealed: Site 3. 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 Pathology results are CONCORDANT with imaging findings, per Dr. Corean Salter. PATHOLOGY revealed: Site 4. Breast, right, needle core biopsy, 10:00 9 cmfn (heart clip)- INVASIVE MODERATELY DIFFERENTIATED DUCTAL ADENOCARCINOMA, GRADE 2; OVERALL GRADE: 2; NEGATIVE FOR ANGIOLYMPHATIC INVASION; MICROCALCIFICATIONS PRESENT WITHIN INVASIVE TUMOR; TUMOR MEASURES A 6.5 MM IN GREATEST LINEAR EXTENT. Pathology results are CONCORDANT with imaging findings, per Dr. Corean Salter. Pathology results and recommendations below were discussed with patient by telephone. Patient reported biopsy site within normal limits with slight tenderness at the  site. Post biopsy care instructions were reviewed, questions were answered and my direct phone number was provided to patient. Patient was instructed to call [facility] if any concerns or questions arise related to the biopsy. RECOMMENDATIONS: 1. Surgical and oncological consultation. Request for surgical and oncological consultation was relayed to Shasta Ada, Nurse Navigator at [facility]. 2. Recommend pretreatment bilateral breast MRI with and without contrast to determine extent of breast disease. 3.  Clips are at least 5 cm apart on the left Pathology results reported by Mliss CHARM Molt RN 08/01/2024. Electronically Signed   By: Corean Salter M.D.   On: 08/04/2024 12:15   Result Date: 08/04/2024 CLINICAL DATA:  Suspicious bilateral breast masses EXAM: ULTRASOUND GUIDED RIGHT BREAST CORE NEEDLE BIOPSY ULTRASOUND GUIDED LEFT BREAST CORE NEEDLE BIOPSY x 3 COMPARISON:  Previous exam(s). PROCEDURE: I met with the patient and we discussed the procedure of ultrasound-guided biopsy, including benefits and alternatives. We discussed the high likelihood of a successful procedure. We discussed the risks of the procedure, including infection, bleeding, tissue injury, clip migration, and inadequate sampling. Informed written consent was given. The usual time-out protocol was performed immediately prior to the procedure. On setup for patient, a heterogeneous non mass area in the LEFT outer breast in the immediate retroareolar region was identified. This is estimated to span at least 13 x 7 x 14 mm. This is favored to correspond to the focal asymmetry noted at anterior depth. This was discussed with patient and decision to proceed with 3 site LEFT breast ultrasound-guided biopsy was discussed with patient. She expressed understanding and agreed. Site 1: LEFT breast 3 o'clock 2 cm from nipple Lesion quadrant: Lower outer quadrant Using sterile technique and 1% lidocaine  and 1% lidocaine  with  epinephrine  as local anesthetic, under direct ultrasound visualization, a 14 gauge spring-loaded device was used to perform biopsy of a mass at 3 o'clock 2 cm the nipple using a lateral approach. At the conclusion of the procedure a COIL-shaped tissue marker clip was deployed into the biopsy cavity. Marker could not be well visualized upon deployment due to overlapping gas. Site 2: LEFT breast 4 o'clock 4 cm from the nipple Lesion quadrant: Lower outer quadrant Using sterile technique and 1% lidocaine  and 1% lidocaine  with epinephrine  as local anesthetic, under direct ultrasound visualization, a 14 gauge spring-loaded device was used to perform biopsy of a mass at 4 o'clock 4 cm  from the nipple using a lateral approach. At the conclusion of the procedure a RIBBON-shaped tissue marker clip was deployed into the biopsy cavity. Site 3: LEFT breast 3 o'clock 1 cm from the nipple Lesion quadrant: Lower outer quadrant Using sterile technique and 1% lidocaine  and 1% lidocaine  with epinephrine  as local anesthetic, under direct ultrasound visualization, a 14 gauge spring-loaded device was used to perform biopsy of an area at 3 o'clock 1 cm from the nipple using a inferolateral approach. At the conclusion of the procedure a venus-shaped tissue marker clip was deployed into the biopsy cavity. Site 4: RIGHT breast 10 o'clock 9 cm from the nipple Lesion quadrant: Upper outer quadrant Using sterile technique and 1% lidocaine  and 1% lidocaine  with epinephrine  as local anesthetic, under direct ultrasound visualization, a 14 gauge spring-loaded device was used to perform biopsy of a mass at 10 o'clock 9 cm from nipple using a inferolateral approach. At the conclusion of the procedure a heart-shaped tissue marker clip was deployed into the biopsy cavity. Follow up 2 view mammogram was performed and dictated separately. IMPRESSION: 1. During setup for ultrasound-guided biopsy, a 14 mm non mass area at 3 o'clock 1 cm from the nipple in  the LEFT breast was identified. This is favored to correspond to the anterior depth focal asymmetry. Ultrasound-guided biopsy was subsequently performed on the site (site 3). 2. Status post ultrasound-guided biopsy of 3 LEFT breast masses and 1 RIGHT breast mass. No apparent complication. Electronically Signed: By: Corean Salter M.D. On: 07/30/2024 11:57   MM CLIP PLACEMENT LEFT Result Date: 07/30/2024 CLINICAL DATA:  Status post ultrasound-guided biopsy EXAM: 3D DIAGNOSTIC LEFT MAMMOGRAM POST ULTRASOUND BIOPSY COMPARISON:  Previous exam(s). ACR Breast Density Category b: There are scattered areas of fibroglandular density. FINDINGS: 3D Mammographic images were obtained following ultrasound guided biopsy of a LEFT breast mass at 3 o'clock 2 cm from the nipple. The COIL biopsy marking clip appears more laterally located than expected and may have been displaced at time of biopsy. 3D Mammographic images were obtained following ultrasound guided biopsy of a LEFT breast mass 4 o'clock 4 cm from the nipple. The RIBBON biopsy marking clip is in expected position at the site of biopsy. 3D Mammographic images were obtained following ultrasound guided biopsy of a LEFT breast mass at 3 o'clock 1 cm from the nipple. The venus biopsy marking clip is in expected position at the site of biopsy. IMPRESSION: 1. Possible lateral displacement of the COIL shaped biopsy marking clip at the site of biopsy in the central outer breast at middle to posterior depth. 2. Appropriate positioning of the RIBBON shaped biopsy marking clip at the site of biopsy in the central outer outer breast at middle to posterior depth. 3. Appropriate positioning of the venus shaped biopsy marking clip at the site of biopsy in the central outer breast at anterior depth. Final Assessment: Post Procedure Mammograms for Marker Placement Electronically Signed   By: Corean Salter M.D.   On: 07/30/2024 10:39   MM CLIP PLACEMENT RIGHT Result Date:  07/30/2024 CLINICAL DATA:  Status post ultrasound-guided biopsy EXAM: 3D DIAGNOSTIC RIGHT MAMMOGRAM POST ULTRASOUND BIOPSY COMPARISON:  Previous exam(s). ACR Breast Density Category b: There are scattered areas of fibroglandular density. FINDINGS: 3D Mammographic images were obtained following ultrasound guided biopsy of a RIGHT breast mass. The heart shaped biopsy marking clip is in expected position at the site of biopsy. IMPRESSION: Appropriate positioning of the heart shaped biopsy marking clip at the site of biopsy  in the upper outer breast. Final Assessment: Post Procedure Mammograms for Marker Placement Electronically Signed   By: Corean Salter M.D.   On: 07/30/2024 09:48   MM 3D DIAGNOSTIC MAMMOGRAM BILATERAL BREAST Result Date: 07/30/2024 CLINICAL DATA:  Bilateral callback EXAM: DIGITAL DIAGNOSTIC BILATERAL MAMMOGRAM WITH TOMOSYNTHESIS AND CAD; ULTRASOUND RIGHT BREAST LIMITED; ULTRASOUND LEFT BREAST LIMITED TECHNIQUE: Bilateral digital diagnostic mammography and breast tomosynthesis was performed. The images were evaluated with computer-aided detection. ; Targeted ultrasound examination of the right breast was performed; Targeted ultrasound examination of the left breast was performed. COMPARISON:  Previous exam(s). ACR Breast Density Category b: There are scattered areas of fibroglandular density. FINDINGS: Spot compression tomosynthesis views demonstrate a persistent irregular mass with spiculated margins in the RIGHT upper outer breast at posterior depth. Spot compression tomosynthesis views of the LEFT breast demonstrate a persistent focal asymmetry in a linear distribution in the central breast. There is a focal asymmetry at posterior depth and in the immediate retroareolar breast anteriorly. This spans approximately 5 cm. Additional questioned asymmetry on MLO view only is not reproduced. Bilateral vascular calcifications. Targeted ultrasound was performed of the RIGHT breast. At 10 o'clock 9  cm from nipple there is an irregular hypoechoic mass with irregular margins. It measures 10 x 9 x 9 mm. This corresponds to the mass noted mammographically. Targeted ultrasound was performed of the RIGHT axilla. No suspicious axillary lymph nodes are visualized. Targeted ultrasound was performed of the LEFT breast. At 3 o'clock 2 cm from nipple, there is an irregular hypoechoic mass with irregular margins. It measures 7 x 7 x 10 mm. Adjacent at 4 o'clock 4 cm from the nipple is an additional irregular hypoechoic mass with irregular margins. It measures 16 x 9 x 7 mm. These correspond to the focal asymmetry noted mammographically. They span at least 2.5 cm sonographically. Targeted ultrasound was performed of the LEFT axilla. No suspicious axillary lymph nodes are visualized. IMPRESSION: 1. There is a highly suspicious 10 mm RIGHT breast mass. Recommend ultrasound-guided biopsy for definitive characterization. 2. No suspicious RIGHT axillary adenopathy. 3. There are 2 suspicious LEFT breast masses which span approximately 5 cm mammographically in total. Recommend 2 site ultrasound-guided biopsy for definitive characterization. 4. No suspicious LEFT axillary adenopathy. RECOMMENDATION: 1. RIGHT breast ultrasound-guided biopsy x1 2. LEFT breast ultrasound-guided biopsy x1 I have discussed the findings and recommendations with the patient. The biopsy procedure was discussed with the patient and questions were answered. Patient expressed their understanding of the biopsy recommendation. Patient will be scheduled for biopsy today. Ordering provider will be notified. If applicable, a reminder letter will be sent to the patient regarding the next appointment. BI-RADS CATEGORY  5: Highly suggestive of malignancy. Electronically Signed   By: Corean Salter M.D.   On: 07/30/2024 08:37   US  LIMITED ULTRASOUND INCLUDING AXILLA RIGHT BREAST Result Date: 07/30/2024 CLINICAL DATA:  Bilateral callback EXAM: DIGITAL DIAGNOSTIC  BILATERAL MAMMOGRAM WITH TOMOSYNTHESIS AND CAD; ULTRASOUND RIGHT BREAST LIMITED; ULTRASOUND LEFT BREAST LIMITED TECHNIQUE: Bilateral digital diagnostic mammography and breast tomosynthesis was performed. The images were evaluated with computer-aided detection. ; Targeted ultrasound examination of the right breast was performed; Targeted ultrasound examination of the left breast was performed. COMPARISON:  Previous exam(s). ACR Breast Density Category b: There are scattered areas of fibroglandular density. FINDINGS: Spot compression tomosynthesis views demonstrate a persistent irregular mass with spiculated margins in the RIGHT upper outer breast at posterior depth. Spot compression tomosynthesis views of the LEFT breast demonstrate a persistent focal asymmetry in  a linear distribution in the central breast. There is a focal asymmetry at posterior depth and in the immediate retroareolar breast anteriorly. This spans approximately 5 cm. Additional questioned asymmetry on MLO view only is not reproduced. Bilateral vascular calcifications. Targeted ultrasound was performed of the RIGHT breast. At 10 o'clock 9 cm from nipple there is an irregular hypoechoic mass with irregular margins. It measures 10 x 9 x 9 mm. This corresponds to the mass noted mammographically. Targeted ultrasound was performed of the RIGHT axilla. No suspicious axillary lymph nodes are visualized. Targeted ultrasound was performed of the LEFT breast. At 3 o'clock 2 cm from nipple, there is an irregular hypoechoic mass with irregular margins. It measures 7 x 7 x 10 mm. Adjacent at 4 o'clock 4 cm from the nipple is an additional irregular hypoechoic mass with irregular margins. It measures 16 x 9 x 7 mm. These correspond to the focal asymmetry noted mammographically. They span at least 2.5 cm sonographically. Targeted ultrasound was performed of the LEFT axilla. No suspicious axillary lymph nodes are visualized. IMPRESSION: 1. There is a highly  suspicious 10 mm RIGHT breast mass. Recommend ultrasound-guided biopsy for definitive characterization. 2. No suspicious RIGHT axillary adenopathy. 3. There are 2 suspicious LEFT breast masses which span approximately 5 cm mammographically in total. Recommend 2 site ultrasound-guided biopsy for definitive characterization. 4. No suspicious LEFT axillary adenopathy. RECOMMENDATION: 1. RIGHT breast ultrasound-guided biopsy x1 2. LEFT breast ultrasound-guided biopsy x1 I have discussed the findings and recommendations with the patient. The biopsy procedure was discussed with the patient and questions were answered. Patient expressed their understanding of the biopsy recommendation. Patient will be scheduled for biopsy today. Ordering provider will be notified. If applicable, a reminder letter will be sent to the patient regarding the next appointment. BI-RADS CATEGORY  5: Highly suggestive of malignancy. Electronically Signed   By: Corean Salter M.D.   On: 07/30/2024 08:37   US  LIMITED ULTRASOUND INCLUDING AXILLA LEFT BREAST  Result Date: 07/30/2024 CLINICAL DATA:  Bilateral callback EXAM: DIGITAL DIAGNOSTIC BILATERAL MAMMOGRAM WITH TOMOSYNTHESIS AND CAD; ULTRASOUND RIGHT BREAST LIMITED; ULTRASOUND LEFT BREAST LIMITED TECHNIQUE: Bilateral digital diagnostic mammography and breast tomosynthesis was performed. The images were evaluated with computer-aided detection. ; Targeted ultrasound examination of the right breast was performed; Targeted ultrasound examination of the left breast was performed. COMPARISON:  Previous exam(s). ACR Breast Density Category b: There are scattered areas of fibroglandular density. FINDINGS: Spot compression tomosynthesis views demonstrate a persistent irregular mass with spiculated margins in the RIGHT upper outer breast at posterior depth. Spot compression tomosynthesis views of the LEFT breast demonstrate a persistent focal asymmetry in a linear distribution in the central breast.  There is a focal asymmetry at posterior depth and in the immediate retroareolar breast anteriorly. This spans approximately 5 cm. Additional questioned asymmetry on MLO view only is not reproduced. Bilateral vascular calcifications. Targeted ultrasound was performed of the RIGHT breast. At 10 o'clock 9 cm from nipple there is an irregular hypoechoic mass with irregular margins. It measures 10 x 9 x 9 mm. This corresponds to the mass noted mammographically. Targeted ultrasound was performed of the RIGHT axilla. No suspicious axillary lymph nodes are visualized. Targeted ultrasound was performed of the LEFT breast. At 3 o'clock 2 cm from nipple, there is an irregular hypoechoic mass with irregular margins. It measures 7 x 7 x 10 mm. Adjacent at 4 o'clock 4 cm from the nipple is an additional irregular hypoechoic mass with irregular margins. It measures 16  x 9 x 7 mm. These correspond to the focal asymmetry noted mammographically. They span at least 2.5 cm sonographically. Targeted ultrasound was performed of the LEFT axilla. No suspicious axillary lymph nodes are visualized. IMPRESSION: 1. There is a highly suspicious 10 mm RIGHT breast mass. Recommend ultrasound-guided biopsy for definitive characterization. 2. No suspicious RIGHT axillary adenopathy. 3. There are 2 suspicious LEFT breast masses which span approximately 5 cm mammographically in total. Recommend 2 site ultrasound-guided biopsy for definitive characterization. 4. No suspicious LEFT axillary adenopathy. RECOMMENDATION: 1. RIGHT breast ultrasound-guided biopsy x1 2. LEFT breast ultrasound-guided biopsy x1 I have discussed the findings and recommendations with the patient. The biopsy procedure was discussed with the patient and questions were answered. Patient expressed their understanding of the biopsy recommendation. Patient will be scheduled for biopsy today. Ordering provider will be notified. If applicable, a reminder letter will be sent to the  patient regarding the next appointment. BI-RADS CATEGORY  5: Highly suggestive of malignancy. Electronically Signed   By: Corean Salter M.D.   On: 07/30/2024 08:37   MM 3D SCREENING MAMMOGRAM BILATERAL BREAST Result Date: 07/24/2024 CLINICAL DATA:  Screening. EXAM: DIGITAL SCREENING BILATERAL MAMMOGRAM WITH TOMOSYNTHESIS AND CAD TECHNIQUE: Bilateral screening digital craniocaudal and mediolateral oblique mammograms were obtained. Bilateral screening digital breast tomosynthesis was performed. The images were evaluated with computer-aided detection. COMPARISON:  None available.  2016 prior unable to be loaded. ACR Breast Density Category b: There are scattered areas of fibroglandular density. FINDINGS: In the right breast a possible mass requires further evaluation. In the left breast a possible focal asymmetry with distortion in the central breast at the posterior depth, a CC only asymmetry at the anterior depth, and a partially visualized MLO only asymmetry at the posterior depth require further evaluation. IMPRESSION: Further evaluation is suggested for possible mass in the right breast. Further evaluation is suggested for possible asymmetries and possible distortion in the left breast. RECOMMENDATION: Diagnostic mammogram and possibly ultrasound of both breasts. (Code:[id number]-[id number]) The patient will be contacted regarding the findings, and additional imaging will be scheduled. BI-RADS CATEGORY  0: Incomplete: Need additional imaging evaluation. Electronically Signed   By: Norleen Croak M.D.   On: 07/24/2024 12:09    "

## 2024-08-06 NOTE — Progress Notes (Signed)
 Accompanied patient and family to initial medical oncology appointment.   Reviewed Breast Cancer treatment handbook.   Care plan summary given to patient.   Reviewed outreach programs and cancer center services.

## 2024-08-06 NOTE — Progress Notes (Signed)
 START ON PATHWAY REGIMEN - Breast     Cycle 1: A cycle is 21 days:     Pertuzumab-xxxx      Trastuzumab-xxxx      Docetaxel      Carboplatin    Cycles 2 through 6: A cycle is every 21 days:     Pertuzumab-xxxx      Trastuzumab-xxxx      Docetaxel      Carboplatin   **Always confirm dose/schedule in your pharmacy ordering system**  Patient Characteristics: Preoperative or Nonsurgical Candidate, M0 (Clinical Staging), Up to cT4c, Any N, M0, Neoadjuvant Therapy followed by Surgery, Invasive Disease, Chemotherapy, HER2 Positive, ER Positive Therapeutic Status: Preoperative or Nonsurgical Candidate, M0 (Clinical Staging) AJCC T Category: cT3 AJCC N Category: cN0 AJCC M Category: cM0 AJCC Grade: G3 ER Status: Positive (+) PR Status: Negative (-) HER2 Status: Positive (+) AJCC 8 Stage Grouping: IIB Breast Surgical Plan: Neoadjuvant Therapy followed by Surgery Intent of Therapy: Curative Intent, Discussed with Patient

## 2024-08-07 ENCOUNTER — Other Ambulatory Visit: Payer: Self-pay

## 2024-08-07 LAB — CANCER ANTIGEN 27.29: CA 27.29: 34.1 U/mL (ref 0.0–38.6)

## 2024-08-07 LAB — CANCER ANTIGEN 15-3: CA 15-3: 25 U/mL (ref 0.0–25.0)

## 2024-08-08 ENCOUNTER — Ambulatory Visit
Admission: RE | Admit: 2024-08-08 | Discharge: 2024-08-08 | Disposition: A | Source: Ambulatory Visit | Attending: Oncology

## 2024-08-08 DIAGNOSIS — E785 Hyperlipidemia, unspecified: Secondary | ICD-10-CM | POA: Diagnosis not present

## 2024-08-08 DIAGNOSIS — I10 Essential (primary) hypertension: Secondary | ICD-10-CM | POA: Insufficient documentation

## 2024-08-08 DIAGNOSIS — Z2989 Encounter for other specified prophylactic measures: Secondary | ICD-10-CM | POA: Insufficient documentation

## 2024-08-08 DIAGNOSIS — Z0189 Encounter for other specified special examinations: Secondary | ICD-10-CM | POA: Diagnosis not present

## 2024-08-08 LAB — ECHOCARDIOGRAM COMPLETE
Area-P 1/2: 2.48 cm2
Calc EF: 70.1 %
S' Lateral: 2.6 cm
Single Plane A2C EF: 71.1 %
Single Plane A4C EF: 67.8 %

## 2024-08-08 NOTE — Progress Notes (Signed)
" °  Echocardiogram 2D Echocardiogram has been performed.  Isabel Welch 08/08/2024, 9:31 AM "

## 2024-08-08 NOTE — Progress Notes (Signed)
 Pharmacist Chemotherapy Monitoring - Initial Assessment    Anticipated start date: 08/20/24   The following has been reviewed per standard work regarding the patient's treatment regimen: The patient's diagnosis, treatment plan and drug doses, and organ/hematologic function Lab orders and baseline tests specific to treatment regimen  The treatment plan start date, drug sequencing, and pre-medications Prior authorization status  Patient's documented medication list, including drug-drug interaction screen and prescriptions for anti-emetics and supportive care specific to the treatment regimen The drug concentrations, fluid compatibility, administration routes, and timing of the medications to be used The patient's access for treatment and lifetime cumulative dose history, if applicable  The patient's medication allergies and previous infusion related reactions, if applicable  cT3 cN0 left breast invasive carcinoma, 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  neoadjuvant chemotherapy [facility]P followed by surgery Changes made to treatment plan:  N/A  Follow up needed:  N/A   Redell JINNY Gaskins, [facility], 08/08/2024  12:38 PM

## 2024-08-11 ENCOUNTER — Ambulatory Visit
Admission: RE | Admit: 2024-08-11 | Discharge: 2024-08-11 | Disposition: A | Discharge status: Home / Self Care | Source: Ambulatory Visit | Attending: Oncology | Admitting: Oncology

## 2024-08-11 DIAGNOSIS — C50912 Malignant neoplasm of unspecified site of left female breast: Secondary | ICD-10-CM | POA: Diagnosis present

## 2024-08-11 MED ORDER — GADOBUTROL 1 MMOL/ML IV SOLN
7.5000 mL | Freq: Once | INTRAVENOUS | Status: AC | PRN
  Administered 2024-08-11: 7.5 mL via INTRAVENOUS

## 2024-08-13 ENCOUNTER — Telehealth: Payer: Self-pay | Admitting: Surgery

## 2024-08-13 ENCOUNTER — Ambulatory Visit: Admitting: Surgery

## 2024-08-13 ENCOUNTER — Encounter: Payer: Self-pay | Admitting: Surgery

## 2024-08-13 VITALS — BP 119/82 | HR 79 | Ht 63.0 in | Wt 174.0 lb

## 2024-08-13 DIAGNOSIS — C50911 Malignant neoplasm of unspecified site of right female breast: Secondary | ICD-10-CM | POA: Diagnosis not present

## 2024-08-13 DIAGNOSIS — C50912 Malignant neoplasm of unspecified site of left female breast: Secondary | ICD-10-CM

## 2024-08-13 NOTE — Telephone Encounter (Signed)
 Patient has been advised of Pre-Admission date/time, and Surgery date at [facility].  Surgery Date: 08/14/24 Preadmission Testing Date: 2 hrs early   Patient informed of the scheduling process and surgery information given at time of office visit.  Patient has been made aware to call [phone], between 1-3:00pm the day before surgery, to find out what time to arrive for surgery.

## 2024-08-13 NOTE — H&P (View-Only) (Signed)
 Patient ID: Isabel Welch, female   DOB: 1960-11-03, 64 y.o.   MRN: [id number]  HPI [name] is a 64 y.o. female seen in consultation  For bilateral breast ca. After routine mammo was done 9 after a 9 year hiatus). She does not do self exam.  Mammo and MRI  pers reviewed mass right UO breast and also large 5.8 cm central lesion on the left 1. Left breast 3 o'clock 2 cm from the nipple (coil shaped clip) invasive poorly differentiated ductal adenocarcinoma. 2. Left breast 4 o'clock 4 cm from the nipple (ribbon shaped clip) invasive poorly differentiated ductal adenocarcinoma. 3. Left breast 3 o'clock 1 cm from the nipple (venous shaped clip) invasive poorly differentiated ductal adenocarcinoma. 4. Right breast 10 o'clock 9 cm from the nipple (heart shaped clip) invasive moderately differentiated ductal adenocarcinoma.   No fam hx of breat Ca. Aunt with ovarian Ca and lug ca G5 p3 M1 A1  HPI  Past Medical History:  Diagnosis Date   Hypertension    Kidney stone     Past Surgical History:  Procedure Laterality Date   BREAST BIOPSY Right 07/30/2024   u/s bx path pending   BREAST BIOPSY Left 07/30/2024   u/s bx x3   BREAST BIOPSY Right 07/30/2024   US  RT BREAST BX W LOC DEV 1ST LESION IMG BX SPEC US  GUIDE 07/30/2024 [facility]   BREAST BIOPSY Left 07/30/2024   US  LT BREAST BX W LOC DEV EA ADD LESION IMG BX SPEC US  GUIDE 07/30/2024 [facility]   BREAST BIOPSY Left 07/30/2024   US  LT BREAST BX W LOC DEV 1ST LESION IMG BX SPEC US  GUIDE 07/30/2024 [facility]   BREAST BIOPSY Left 07/30/2024   US  LT BREAST BX W LOC DEV EA ADD LESION IMG BX SPEC US  GUIDE 07/30/2024 [facility]   gallbladder stents     LEAP     TUBAL LIGATION      Family History  Problem Relation Age of Onset   Cancer Mother    Lung cancer Mother    Heart failure Father    Lung cancer Maternal Aunt    Ovarian cancer Maternal Aunt    Stomach cancer Maternal Aunt     Social  History Social History[1]  Allergies[2]  Current Outpatient Medications  Medication Sig Dispense Refill   amLODipine  (NORVASC ) 2.5 MG tablet TAKE 1 TABLET BY MOUTH DAILY 100 tablet 2   ARIPiprazole (ABILIFY) 5 MG tablet Take 5 mg by mouth daily.  3   atorvastatin  (LIPITOR) 10 MG tablet TAKE 1 TABLET BY MOUTH DAILY 100 tablet 2   cyclobenzaprine  (FLEXERIL ) 10 MG tablet Take 1 tablet (10 mg total) by mouth 3 (three) times daily as needed for muscle spasms. 20 tablet 0   dexamethasone  (DECADRON ) 4 MG tablet Take 2 tabs by mouth 2 times daily starting day before chemo. Then take 1 tab daily for 2 days starting day after chemo. Take with food. 30 tablet 1   lidocaine -prilocaine  (EMLA ) cream Apply to affected area once 30 g 3   omeprazole  (PRILOSEC  OTC) 20 MG tablet Take 1 tablet (20 mg total) by mouth daily. 28 tablet 1   ondansetron  (ZOFRAN ) 8 MG tablet Take 1 tablet (8 mg total) by mouth every 8 (eight) hours as needed for nausea or vomiting. Start on the third day after chemotherapy. 30 tablet 1   prochlorperazine  (COMPAZINE ) 10 MG tablet Take 1 tablet (10 mg total) by mouth every 6 (six) hours as needed for nausea or  vomiting. 30 tablet 1   traZODone (DESYREL) 100 MG tablet Take 100 mg by mouth at bedtime.      No current facility-administered medications for this visit.     Review of Systems Full ROS  was asked and was negative except for the information on the HPI  Physical Exam Blood pressure 119/82, pulse 79, height 5' 3 (1.6 m), weight 174 lb (78.9 kg), SpO2 98%. CONSTITUTIONAL: NAD chaperone present. EYES: Pupils are equal, round, Sclera are non-icteric. EARS, NOSE, MOUTH AND THROAT: The oropharynx is clear. The oral mucosa is pink and moist. Hearing is intact to voice. LYMPH NODES:  Lymph nodes in the neck are normal. RESPIRATORY:  Lungs are clear. There is normal respiratory effort, with equal breath sounds bilaterally, and without pathologic use of accessory  muscles. CARDIOVASCULAR: Heart is regular without murmurs, gallops, or rubs. BREAST:  Right: upper outer quadrant near axilla 3x2 cm mass, ill defined. Nml nipples. No clear evidence of axillary Adenopathy but mass within the breast may represent axillary adenopathy as it is far up Left: breast mass encompassing lower outer and inner quadrants from around 4 o'clock to about 8 o'clock, it measures about 7 cms in diameter, no nipple involvement No axillary adenopathy GI: The abdomen is  soft, nontender, and nondistended. There are no palpable masses. There is no hepatosplenomegaly. There are normal bowel sounds in all quadrants. GU: Rectal deferred.   MUSCULOSKELETAL: Normal muscle strength and tone. No cyanosis or edema.   SKIN: Turgor is good and there are no pathologic skin lesions or ulcers. NEUROLOGIC: Motor and sensation is grossly normal. Cranial nerves are grossly intact. PSYCH:  Oriented to person, place and time. Affect is normal.  Data Reviewed I have personally reviewed the patient's imaging, laboratory findings and medical records.    Assessment/Plan 64 yo feMale with bilateral breast cancers in need for neoadjuvant chemo and port . We will schedule her port placement.  Procedure discussed with her in detail.  The risk benefits and possible medication including but not limited to: Bleeding, infection serous, vascular injury.  She understands and wishes to proceed.  Will be also happy to follow her up after she completes chemotherapy We also talked about surgical therapy for breast cancer and they are interested in pursuing bilateral mastectomies + bilatera SLNbx in the future aftr completion of chemo. Not really interested in reconstruction. I think specifically left side will be problematic doing only lumpectomy given extension of the disease She does have upcoming appt w genetic counselor. I personally spent a total of 60 minutes in the care of the patient today including  performing a medically appropriate exam/evaluation, counseling and educating, placing orders, referring and communicating with other health care professionals, documenting clinical information in the EHR, independently interpreting and reviewing images studies and coordinating care.    Laneta Luna, MD FACS General Surgeon 08/13/2024, 10:09 AM       [1]  Social History Tobacco Use   Smoking status: Never   Smokeless tobacco: Never  Vaping Use   Vaping status: Never Used  Substance Use Topics   Alcohol use: Not Currently    Comment: on occasion   Drug use: Never  [2] No Known Allergies

## 2024-08-13 NOTE — Progress Notes (Signed)
 Patient ID: Isabel Welch, female   DOB: 1960-11-03, 64 y.o.   MRN: [id number]  HPI Isabel Welch is a 64 y.o. female seen in consultation  For bilateral breast ca. After routine mammo was done 9 after a 9 year hiatus). She does not do self exam.  Mammo and MRI  pers reviewed mass right UO breast and also large 5.8 cm central lesion on the left 1. Left breast 3 o'clock 2 cm from the nipple (coil shaped clip) invasive poorly differentiated ductal adenocarcinoma. 2. Left breast 4 o'clock 4 cm from the nipple (ribbon shaped clip) invasive poorly differentiated ductal adenocarcinoma. 3. Left breast 3 o'clock 1 cm from the nipple (venous shaped clip) invasive poorly differentiated ductal adenocarcinoma. 4. Right breast 10 o'clock 9 cm from the nipple (heart shaped clip) invasive moderately differentiated ductal adenocarcinoma.   No fam hx of breat Ca. Aunt with ovarian Ca and lug ca G5 p3 M1 A1  HPI  Past Medical History:  Diagnosis Date   Hypertension    Kidney stone     Past Surgical History:  Procedure Laterality Date   BREAST BIOPSY Right 07/30/2024   u/s bx path pending   BREAST BIOPSY Left 07/30/2024   u/s bx x3   BREAST BIOPSY Right 07/30/2024   US  RT BREAST BX W LOC DEV 1ST LESION IMG BX SPEC US  GUIDE 07/30/2024 [facility]   BREAST BIOPSY Left 07/30/2024   US  LT BREAST BX W LOC DEV EA ADD LESION IMG BX SPEC US  GUIDE 07/30/2024 [facility]   BREAST BIOPSY Left 07/30/2024   US  LT BREAST BX W LOC DEV 1ST LESION IMG BX SPEC US  GUIDE 07/30/2024 [facility]   BREAST BIOPSY Left 07/30/2024   US  LT BREAST BX W LOC DEV EA ADD LESION IMG BX SPEC US  GUIDE 07/30/2024 [facility]   gallbladder stents     LEAP     TUBAL LIGATION      Family History  Problem Relation Age of Onset   Cancer Mother    Lung cancer Mother    Heart failure Father    Lung cancer Maternal Aunt    Ovarian cancer Maternal Aunt    Stomach cancer Maternal Aunt     Social  History Social History[1]  Allergies[2]  Current Outpatient Medications  Medication Sig Dispense Refill   amLODipine  (NORVASC ) 2.5 MG tablet TAKE 1 TABLET BY MOUTH DAILY 100 tablet 2   ARIPiprazole (ABILIFY) 5 MG tablet Take 5 mg by mouth daily.  3   atorvastatin  (LIPITOR) 10 MG tablet TAKE 1 TABLET BY MOUTH DAILY 100 tablet 2   cyclobenzaprine  (FLEXERIL ) 10 MG tablet Take 1 tablet (10 mg total) by mouth 3 (three) times daily as needed for muscle spasms. 20 tablet 0   dexamethasone  (DECADRON ) 4 MG tablet Take 2 tabs by mouth 2 times daily starting day before chemo. Then take 1 tab daily for 2 days starting day after chemo. Take with food. 30 tablet 1   lidocaine -prilocaine  (EMLA ) cream Apply to affected area once 30 g 3   omeprazole  (PRILOSEC  OTC) 20 MG tablet Take 1 tablet (20 mg total) by mouth daily. 28 tablet 1   ondansetron  (ZOFRAN ) 8 MG tablet Take 1 tablet (8 mg total) by mouth every 8 (eight) hours as needed for nausea or vomiting. Start on the third day after chemotherapy. 30 tablet 1   prochlorperazine  (COMPAZINE ) 10 MG tablet Take 1 tablet (10 mg total) by mouth every 6 (six) hours as needed for nausea or  vomiting. 30 tablet 1   traZODone (DESYREL) 100 MG tablet Take 100 mg by mouth at bedtime.      No current facility-administered medications for this visit.     Review of Systems Full ROS  was asked and was negative except for the information on the HPI  Physical Exam Blood pressure 119/82, pulse 79, height 5' 3 (1.6 m), weight 174 lb (78.9 kg), SpO2 98%. CONSTITUTIONAL: NAD chaperone present. EYES: Pupils are equal, round, Sclera are non-icteric. EARS, NOSE, MOUTH AND THROAT: The oropharynx is clear. The oral mucosa is pink and moist. Hearing is intact to voice. LYMPH NODES:  Lymph nodes in the neck are normal. RESPIRATORY:  Lungs are clear. There is normal respiratory effort, with equal breath sounds bilaterally, and without pathologic use of accessory  muscles. CARDIOVASCULAR: Heart is regular without murmurs, gallops, or rubs. BREAST:  Right: upper outer quadrant near axilla 3x2 cm mass, ill defined. Nml nipples. No clear evidence of axillary Adenopathy but mass within the breast may represent axillary adenopathy as it is far up Left: breast mass encompassing lower outer and inner quadrants from around 4 o'clock to about 8 o'clock, it measures about 7 cms in diameter, no nipple involvement No axillary adenopathy GI: The abdomen is  soft, nontender, and nondistended. There are no palpable masses. There is no hepatosplenomegaly. There are normal bowel sounds in all quadrants. GU: Rectal deferred.   MUSCULOSKELETAL: Normal muscle strength and tone. No cyanosis or edema.   SKIN: Turgor is good and there are no pathologic skin lesions or ulcers. NEUROLOGIC: Motor and sensation is grossly normal. Cranial nerves are grossly intact. PSYCH:  Oriented to person, place and time. Affect is normal.  Data Reviewed I have personally reviewed the patient's imaging, laboratory findings and medical records.    Assessment/Plan 64 yo feMale with bilateral breast cancers in need for neoadjuvant chemo and port . We will schedule her port placement.  Procedure discussed with her in detail.  The risk benefits and possible medication including but not limited to: Bleeding, infection serous, vascular injury.  She understands and wishes to proceed.  Will be also happy to follow her up after she completes chemotherapy We also talked about surgical therapy for breast cancer and they are interested in pursuing bilateral mastectomies + bilatera SLNbx in the future aftr completion of chemo. Not really interested in reconstruction. I think specifically left side will be problematic doing only lumpectomy given extension of the disease She does have upcoming appt w genetic counselor. I personally spent a total of 60 minutes in the care of the patient today including  performing a medically appropriate exam/evaluation, counseling and educating, placing orders, referring and communicating with other health care professionals, documenting clinical information in the EHR, independently interpreting and reviewing images studies and coordinating care.    Laneta Luna, MD FACS General Surgeon 08/13/2024, 10:09 AM       [1]  Social History Tobacco Use   Smoking status: Never   Smokeless tobacco: Never  Vaping Use   Vaping status: Never Used  Substance Use Topics   Alcohol use: Not Currently    Comment: on occasion   Drug use: Never  [2] No Known Allergies

## 2024-08-13 NOTE — Patient Instructions (Signed)
 We have seen you today and have spoken about your port placement. This will be scheduled at [facility] with Dr. Dana Duncan.  If you are on any injectable weight loss medication, you will need to stop taking your GLP-1 injectable (weight loss) medications 8 days before your surgery to avoid any complications with anesthesia.   Please see the [facility]) Sheet provided for further details. Our surgery scheduler will call you to look at surgery dates and go over surgery information.   Please call our office with any questions or concerns that you have.   [facility]-a-Cath [facility]) A central line is a soft, flexible tube (catheter) that can be used to collect blood for testing or to give medicine or nutrition through a vein. The tip of the central line ends in a large vein just above the heart called the vena cava. A central line may be placed because: You need to get medicines or fluids through an IV tube for a long period of time. You need nutrition but cannot eat or absorb nutrients. The veins in your hands or arms are hard to access. You need to have blood taken often for blood tests. You need a blood transfusion You need chemotherapy or dialysis.  There are many types of central lines: Peripherally inserted central catheter (PICC) line. This type is used for intermediate access to long-term access of one week or more. It can be used to draw blood and give fluids or medicines. A PICC looks like an IV tube, but it goes up the arm to the heart. It is usually inserted in the upper arm and taped in place on the arm. Tunneled central line. This type is used for long-term therapy and dialysis. It is placed in a large vein in the neck, chest, or groin. A tunneled central line is inserted through a small incision made over the vein and is advanced into the heart. It is tunneled beneath the skin and brought out through a second incision. Non-tunneled central line. This type is used for short-term access,  usually of a maximum of 7 days. It is often used in the emergency department. A non-tunneled central line is inserted in the neck, chest, or groin. Implanted port. This type is used for long-term therapy. It can stay in place longer than other types of central lines. An implanted port is normally inserted in the upper chest but can also be placed in the upper arm or in the abdomen. It is inserted and removed with surgery, and it is accessed using a special needle.  The type of central line that you receive depends on how long you will need it, your medical condition, and the condition of your veins. What are the risks? Using any type of central line has risks that you should be aware of, including: Infection. A blood clot that blocks the central line or forms in the vein and travels to the heart. Bleeding from the place where the central line was put in. Developing a hole or crack within the central line. If this happens, the central line will need to be replaced. Developing an abnormal heart rhythm (arrhythmia). This is rare. Central line failure.  Follow these instructions at home: Flushing and cleaning the central line Follow instructions from the health care provider about flushing and cleaning the central line. Wear a mask when flushing or cleaning the central line. Before you flush or clean the central line: Wash your hands with soap and water . Clean the central line  hub with rubbing alcohol. Insertion site care Keep the insertion site of your central line clean and dry at all times. Check your incision or central line site every day for signs of infection. Check for: More redness, swelling, or pain. More fluid or blood. Warmth. Pus or a bad smell. General instructions Follow instructions from your health care provider for the type of device that you have. If the central line accidentally gets pulled on, make sure: The bandage (dressing) is okay. There is no bleeding. The line  has not been pulled out. Return to your normal activities as told by your health care provider. Ask your health care provider what activities are safe for you. You may be restricted from lifting or making repetitive arm movements on the side with the catheter. Do not swim or bathe unless your health care provider approves. Keep your dressing dry. Your health care provider can instruct you about how to keep your specific type of dressing from getting wet. Keep all follow-up visits as told by your health care provider. This is important. Contact a health care provider if: You have more redness, swelling, or pain around your incision. You have more fluid or blood coming from your incision. Your incision feels warm to the touch. You have pus or a bad smell coming from your incision. Get help right away if: You have: Chills. A fever. Shortness of breath. Trouble breathing. Chest pain. Swelling in your neck, face, chest, or arm on the side of your central line. You are coughing. You feel your heart beating rapidly or skipping beats. You feel dizzy or you faint. Your incision or central line site has red streaks spreading away from the area. Your incision or central line site is bleeding and does not stop. Your central line is difficult to flush or will not flush. You do not get a blood return from the central line. Your central line gets loose or comes out. Your central line gets damaged. Your catheter leaks when flushed or when fluids are infused into it. This information is not intended to replace advice given to you by your health care provider. Make sure you discuss any questions you have with your health care provider. Document Released: 08/24/2005 Document Revised: 03/01/2016 Document Reviewed: 02/09/2016 Elsevier Interactive Patient Education  2017 [employer].

## 2024-08-14 ENCOUNTER — Other Ambulatory Visit: Payer: Self-pay

## 2024-08-14 ENCOUNTER — Ambulatory Visit
Admission: RE | Admit: 2024-08-14 | Discharge: 2024-08-14 | Disposition: A | Discharge status: Home / Self Care | Attending: Surgery | Admitting: Surgery

## 2024-08-14 ENCOUNTER — Ambulatory Visit: Admitting: Anesthesiology

## 2024-08-14 ENCOUNTER — Encounter: Admission: RE | Disposition: A | Payer: Self-pay | Source: Home / Self Care | Attending: Surgery

## 2024-08-14 ENCOUNTER — Inpatient Hospital Stay

## 2024-08-14 ENCOUNTER — Ambulatory Visit

## 2024-08-14 ENCOUNTER — Encounter: Payer: Self-pay | Admitting: Surgery

## 2024-08-14 DIAGNOSIS — I10 Essential (primary) hypertension: Secondary | ICD-10-CM | POA: Diagnosis not present

## 2024-08-14 DIAGNOSIS — E669 Obesity, unspecified: Secondary | ICD-10-CM | POA: Diagnosis not present

## 2024-08-14 DIAGNOSIS — C50911 Malignant neoplasm of unspecified site of right female breast: Secondary | ICD-10-CM

## 2024-08-14 DIAGNOSIS — Z6830 Body mass index (BMI) 30.0-30.9, adult: Secondary | ICD-10-CM | POA: Diagnosis not present

## 2024-08-14 DIAGNOSIS — Z419 Encounter for procedure for purposes other than remedying health state, unspecified: Secondary | ICD-10-CM

## 2024-08-14 DIAGNOSIS — C50512 Malignant neoplasm of lower-outer quadrant of left female breast: Secondary | ICD-10-CM | POA: Diagnosis not present

## 2024-08-14 DIAGNOSIS — C50411 Malignant neoplasm of upper-outer quadrant of right female breast: Secondary | ICD-10-CM | POA: Insufficient documentation

## 2024-08-14 DIAGNOSIS — C50912 Malignant neoplasm of unspecified site of left female breast: Secondary | ICD-10-CM

## 2024-08-14 MED ORDER — ORAL CARE MOUTH RINSE: 15.0000 mL | Freq: Once | OROMUCOSAL | Status: AC

## 2024-08-14 MED ORDER — HEPARIN SODIUM (PORCINE) 5000 UNIT/ML IJ SOLN
INTRAMUSCULAR | Status: DC | PRN
  Administered 2024-08-14: 5000 [IU] via SUBCUTANEOUS

## 2024-08-14 MED ORDER — GABAPENTIN 300 MG PO CAPS
ORAL_CAPSULE | ORAL | Status: AC
  Filled 2024-08-14: qty 1

## 2024-08-14 MED ORDER — ACETAMINOPHEN 500 MG PO TABS
ORAL_TABLET | ORAL | Status: AC
  Filled 2024-08-14: qty 2

## 2024-08-14 MED ORDER — EPHEDRINE SULFATE-NACL 50-0.9 MG/10ML-% IV SOSY
PREFILLED_SYRINGE | INTRAVENOUS | Status: DC | PRN
  Administered 2024-08-14: 10 mg via INTRAVENOUS

## 2024-08-14 MED ORDER — ONDANSETRON HCL 4 MG/2ML IJ SOLN
INTRAMUSCULAR | Status: DC | PRN
  Administered 2024-08-14: 4 mg via INTRAVENOUS

## 2024-08-14 MED ORDER — CHLORHEXIDINE GLUCONATE CLOTH 2 % EX PADS: 6.0000 | MEDICATED_PAD | Freq: Once | CUTANEOUS | Status: DC

## 2024-08-14 MED ORDER — SODIUM CHLORIDE (PF) 0.9 % IJ SOLN
INTRAMUSCULAR | Status: DC | PRN
  Administered 2024-08-14: 500 mL via INTRAVENOUS

## 2024-08-14 MED ORDER — CHLORHEXIDINE GLUCONATE 0.12 % MT SOLN
OROMUCOSAL | Status: AC
  Filled 2024-08-14: qty 15

## 2024-08-14 MED ORDER — HEPARIN SODIUM (PORCINE) 5000 UNIT/ML IJ SOLN
INTRAMUSCULAR | Status: AC
  Filled 2024-08-14: qty 1

## 2024-08-14 MED ORDER — CEFAZOLIN SODIUM-DEXTROSE 2-4 GM/100ML-% IV SOLN
2.0000 g | INTRAVENOUS | Status: AC
  Administered 2024-08-14: 2 g via INTRAVENOUS

## 2024-08-14 MED ORDER — MIDAZOLAM HCL 2 MG/2ML IJ SOLN
INTRAMUSCULAR | Status: AC
  Filled 2024-08-14: qty 2

## 2024-08-14 MED ORDER — PROPOFOL 500 MG/50ML IV EMUL
INTRAVENOUS | Status: DC | PRN
  Administered 2024-08-14: 120 ug/kg/min via INTRAVENOUS

## 2024-08-14 MED ORDER — SODIUM CHLORIDE (PF) 0.9 % IJ SOLN
INTRAMUSCULAR | Status: AC
  Filled 2024-08-14: qty 10

## 2024-08-14 MED ORDER — FENTANYL CITRATE (PF) 100 MCG/2ML IJ SOLN
INTRAMUSCULAR | Status: AC
  Filled 2024-08-14: qty 2

## 2024-08-14 MED ORDER — BUPIVACAINE-EPINEPHRINE (PF) 0.25% -1:200000 IJ SOLN
INTRAMUSCULAR | Status: AC
  Filled 2024-08-14: qty 30

## 2024-08-14 MED ORDER — CEFAZOLIN SODIUM-DEXTROSE 2-4 GM/100ML-% IV SOLN
INTRAVENOUS | Status: AC
  Filled 2024-08-14: qty 100

## 2024-08-14 MED ORDER — MIDAZOLAM HCL (PF) 2 MG/2ML IJ SOLN
INTRAMUSCULAR | Status: DC | PRN
  Administered 2024-08-14: 2 mg via INTRAVENOUS

## 2024-08-14 MED ORDER — CHLORHEXIDINE GLUCONATE CLOTH 2 % EX PADS
6.0000 | MEDICATED_PAD | Freq: Once | CUTANEOUS | Status: AC
  Administered 2024-08-13: 6 via TOPICAL

## 2024-08-14 MED ORDER — PHENYLEPHRINE 80 MCG/ML (10ML) SYRINGE FOR IV PUSH (FOR BLOOD PRESSURE SUPPORT)
PREFILLED_SYRINGE | INTRAVENOUS | Status: DC | PRN
  Administered 2024-08-14: 80 ug via INTRAVENOUS
  Administered 2024-08-14: 160 ug via INTRAVENOUS
  Administered 2024-08-14 (×3): 80 ug via INTRAVENOUS

## 2024-08-14 MED ORDER — GABAPENTIN 300 MG PO CAPS
300.0000 mg | ORAL_CAPSULE | ORAL | Status: AC
  Administered 2024-08-14: 300 mg via ORAL

## 2024-08-14 MED ORDER — LIDOCAINE HCL (PF) 1 % IJ SOLN
INTRAMUSCULAR | Status: DC | PRN
  Administered 2024-08-14: 20 mL via SURGICAL_CAVITY

## 2024-08-14 MED ORDER — CHLORHEXIDINE GLUCONATE 0.12 % MT SOLN
15.0000 mL | Freq: Once | OROMUCOSAL | Status: AC
  Administered 2024-08-14: 15 mL via OROMUCOSAL

## 2024-08-14 MED ORDER — HYDROCODONE-ACETAMINOPHEN 5-325 MG PO TABS: 1.0000 | ORAL_TABLET | Freq: Four times a day (QID) | ORAL | 0 refills | Status: AC | PRN

## 2024-08-14 MED ORDER — LACTATED RINGERS IV SOLN: INTRAVENOUS | Status: DC

## 2024-08-14 MED ORDER — LIDOCAINE HCL (PF) 1 % IJ SOLN
INTRAMUSCULAR | Status: AC
  Filled 2024-08-14: qty 30

## 2024-08-14 MED ORDER — FENTANYL CITRATE (PF) 100 MCG/2ML IJ SOLN
INTRAMUSCULAR | Status: DC | PRN
  Administered 2024-08-14 (×4): 25 ug via INTRAVENOUS

## 2024-08-14 MED ORDER — GLYCOPYRROLATE 0.2 MG/ML IJ SOLN
INTRAMUSCULAR | Status: DC | PRN
  Administered 2024-08-14: .2 mg via INTRAVENOUS

## 2024-08-14 MED ORDER — ACETAMINOPHEN 500 MG PO TABS
1000.0000 mg | ORAL_TABLET | ORAL | Status: AC
  Administered 2024-08-14: 1000 mg via ORAL

## 2024-08-14 NOTE — Transfer of Care (Signed)
 Immediate Anesthesia Transfer of Care Note  Patient: Isabel Welch  Procedure(s) Performed: INSERTION, TUNNELED CENTRAL VENOUS DEVICE, WITH PORT (Right: Neck)  Patient Location: PACU  Anesthesia Type:General  Level of Consciousness: drowsy  Airway & Oxygen Therapy: Patient Spontanous Breathing and Patient connected to face mask oxygen  Post-op Assessment: Report given to RN  Post vital signs: stable  Last Vitals:  Vitals Value Taken Time  BP 101/64 08/14/24 12:38  Temp 36.2 C 08/14/24 12:38  Pulse 84 08/14/24 12:39  Resp 15 08/14/24 12:39  SpO2 99 % 08/14/24 12:39  Vitals shown include unfiled device data.  Last Pain:  Vitals:   08/14/24 1045  TempSrc: Temporal  PainSc: 0-No pain         Complications: No notable events documented.

## 2024-08-14 NOTE — Op Note (Signed)
" °  Pre-operative Diagnosis: Bilateral Breast CA  Post-operative Diagnosis: same   Surgeon: Laneta Luna, MD FACS  Anesthesia: IV sedation, marcaine  .25% w epi and lidocaine  1%  Procedure: right IJ  Port placement with fluoroscopy under U/S guidance  Findings: Good position of the tip of the catheter by fluoroscopy  Estimated Blood Loss: Minimal         Drains: None         Specimens: None       Complications: none            Procedure Details  The patient was seen again in the Holding Room. The benefits, complications, treatment options, and expected outcomes were discussed with the patient. The risks of bleeding, infection, recurrence of symptoms, failure to resolve symptoms,  thrombosis nonfunction breakage pneumothorax hemopneumothorax any of which could require chest tube or further surgery were reviewed with the patient.   The patient was taken to Operating Room, identified as Isabel Welch and the procedure verified.  A Time Out was held and the above information confirmed.  Prior to the induction of general anesthesia, antibiotic prophylaxis was administered. VTE prophylaxis was in place. Appropriate anesthesia was then administered and tolerated well. The chest was prepped with Chloraprep and draped in the sterile fashion. The patient was positioned in the supine position. Then the patient was placed in Trendelenburg position.  Patient was prepped and draped in sterile fashion and in a Trendelenburg position local anesthetic was infiltrated into the skin and subcutaneous tissues in the neck and anterior chest wall. The large bore needle was placed into the internal jugular vein under U/S guidance without difficulty and then the Seldinger wire was advanced. Fluoroscopy was utilized to confirm that the Seldinger wire was in the superior vena cava.  An incision was made and a port pocket developed with blunt and electrocautery dissection. The introducer dilator was placed  over the Seldinger wire the wire was removed. The previously flushed catheter was placed into the introducer dilator and the peel-away sheath was removed. The catheter length was confirmed and trimmed utilizing fluoroscopy for proper positioning. The catheter was then attached to the previously flushed port. The port was placed into the pocket. The port was held in with 2-0 Prolenes and flushed for function and heparin  locked.  The wound was closed with interrupted 3-0 Vicryl followed by 4-0 subcuticular Monocryl sutures. Dermabond used to coat the skin  Patient was taken to the recovery room in stable condition where a postoperative chest film has been ordered.   "

## 2024-08-14 NOTE — Anesthesia Preprocedure Evaluation (Addendum)
"                                    Anesthesia Evaluation  Patient identified by MRN, date of birth, ID band Patient awake    Reviewed: Allergy & Precautions, NPO status , Patient's Chart, lab work & pertinent test results  History of Anesthesia Complications Negative for: history of anesthetic complications  Airway Mallampati: II   Neck ROM: Full    Dental  (+) Missing   Pulmonary neg pulmonary ROS   Pulmonary exam normal breath sounds clear to auscultation       Cardiovascular hypertension, Normal cardiovascular exam Rhythm:Regular Rate:Normal     Neuro/Psych  PSYCHIATRIC DISORDERS   Bipolar Disorder   negative neurological ROS     GI/Hepatic ,GERD  ,,  Endo/Other  Prediabetes; obesity  Renal/GU Renal disease (nephrolithiasis)     Musculoskeletal   Abdominal   Peds  Hematology negative hematology ROS (+)   Anesthesia Other Findings   Reproductive/Obstetrics                              Anesthesia Physical Anesthesia Plan  ASA: 2  Anesthesia Plan: General   Post-op Pain Management:    Induction: Intravenous  PONV Risk Score and Plan: 3 and Propofol  infusion, TIVA and Treatment may vary due to age or medical condition  Airway Management Planned: Natural Airway  Additional Equipment:   Intra-op Plan:   Post-operative Plan:   Informed Consent: I have reviewed the patients History and Physical, chart, labs and discussed the procedure including the risks, benefits and alternatives for the proposed anesthesia with the patient or authorized representative who has indicated his/her understanding and acceptance.       Plan Discussed with: CRNA  Anesthesia Plan Comments: (LMA/GETA backup discussed.  Patient consented for risks of anesthesia including but not limited to:  - adverse reactions to medications - damage to eyes, teeth, lips or other oral mucosa - nerve damage due to positioning  - sore throat or  hoarseness - damage to heart, brain, nerves, lungs, other parts of body or loss of life  Informed patient about role of CRNA in peri- and intra-operative care.  Patient voiced understanding.)         Anesthesia Quick Evaluation  "

## 2024-08-14 NOTE — Interval H&P Note (Signed)
 History and Physical Interval Note:  08/14/2024 11:02 AM  Isabel Welch  has presented today for surgery, with the diagnosis of bilateral breast cancer.  The various methods of treatment have been discussed with the patient and family. After consideration of risks, benefits and other options for treatment, the patient has consented to  Procedures: INSERTION, TUNNELED CENTRAL VENOUS DEVICE, WITH PORT (N/A) as a surgical intervention.  The patient's history has been reviewed, patient examined, no change in status, stable for surgery.  I have reviewed the patient's chart and labs.  Questions were answered to the patient's satisfaction.     [facility] F Idaliz Tinkle

## 2024-08-14 NOTE — Anesthesia Postprocedure Evaluation (Signed)
"   Anesthesia Post Note  Patient: Isabel Welch  Procedure(s) Performed: INSERTION, TUNNELED CENTRAL VENOUS DEVICE, WITH PORT (Right: Neck)  Patient location during evaluation: PACU Anesthesia Type: General Level of consciousness: awake and alert, oriented and patient cooperative Pain management: pain level controlled Vital Signs Assessment: post-procedure vital signs reviewed and stable Respiratory status: spontaneous breathing, nonlabored ventilation and respiratory function stable Cardiovascular status: blood pressure returned to baseline and stable Postop Assessment: adequate PO intake Anesthetic complications: no   No notable events documented.   Last Vitals:  Vitals:   08/14/24 1300 08/14/24 1315  BP: 109/76 115/78  Pulse: 79 77  Resp: 14 13  Temp:  36.5 C  SpO2: 95% 96%    Last Pain:  Vitals:   08/14/24 1315  TempSrc:   PainSc: 0-No pain                 Alfonso Ruths      "

## 2024-08-14 NOTE — Discharge Instructions (Addendum)

## 2024-08-14 NOTE — Anesthesia Procedure Notes (Signed)
 Date/Time: 08/14/2024 11:45 AM  Performed by: Rosine Shona Jansky, CRNAPre-anesthesia Checklist: Patient identified, Emergency Drugs available, Suction available and Patient being monitored Patient Re-evaluated:Patient Re-evaluated prior to induction Oxygen Delivery Method: Simple face mask

## 2024-08-15 ENCOUNTER — Encounter: Payer: Self-pay | Admitting: Surgery

## 2024-08-15 ENCOUNTER — Telehealth: Payer: Self-pay | Admitting: Oncology

## 2024-08-15 NOTE — Telephone Encounter (Signed)
 Called pt to get chemo class r/s due to weather - left vm w/appt info and my number incase date won't work for pt - [facility]

## 2024-08-18 ENCOUNTER — Inpatient Hospital Stay: Attending: Oncology

## 2024-08-19 ENCOUNTER — Encounter: Payer: Self-pay | Admitting: Oncology

## 2024-08-19 ENCOUNTER — Inpatient Hospital Stay: Attending: Oncology | Admitting: Licensed Clinical Social Worker

## 2024-08-19 ENCOUNTER — Inpatient Hospital Stay: Admitting: Oncology

## 2024-08-19 ENCOUNTER — Inpatient Hospital Stay: Attending: Oncology

## 2024-08-19 ENCOUNTER — Encounter: Payer: Self-pay | Admitting: Licensed Clinical Social Worker

## 2024-08-19 ENCOUNTER — Inpatient Hospital Stay

## 2024-08-19 VITALS — BP 129/83 | HR 77 | Temp 98.6°F | Resp 18 | Wt 177.0 lb

## 2024-08-19 DIAGNOSIS — C50911 Malignant neoplasm of unspecified site of right female breast: Secondary | ICD-10-CM

## 2024-08-19 DIAGNOSIS — Z5111 Encounter for antineoplastic chemotherapy: Secondary | ICD-10-CM | POA: Insufficient documentation

## 2024-08-19 DIAGNOSIS — F3160 Bipolar disorder, current episode mixed, unspecified: Secondary | ICD-10-CM

## 2024-08-19 DIAGNOSIS — Z8041 Family history of malignant neoplasm of ovary: Secondary | ICD-10-CM

## 2024-08-19 DIAGNOSIS — C50912 Malignant neoplasm of unspecified site of left female breast: Secondary | ICD-10-CM

## 2024-08-19 DIAGNOSIS — Z1379 Encounter for other screening for genetic and chromosomal anomalies: Secondary | ICD-10-CM | POA: Insufficient documentation

## 2024-08-19 LAB — CBC WITH DIFFERENTIAL (CANCER CENTER ONLY)
Abs Immature Granulocytes: 0.03 10*3/uL (ref 0.00–0.07)
Basophils Absolute: 0.1 10*3/uL (ref 0.0–0.1)
Basophils Relative: 1 %
Eosinophils Absolute: 0.1 10*3/uL (ref 0.0–0.5)
Eosinophils Relative: 2 %
HCT: 39.7 % (ref 36.0–46.0)
Hemoglobin: 12.6 g/dL (ref 12.0–15.0)
Immature Granulocytes: 0 %
Lymphocytes Relative: 30 %
Lymphs Abs: 2.2 10*3/uL (ref 0.7–4.0)
MCH: 29 pg (ref 26.0–34.0)
MCHC: 31.7 g/dL (ref 30.0–36.0)
MCV: 91.5 fL (ref 80.0–100.0)
Monocytes Absolute: 0.5 10*3/uL (ref 0.1–1.0)
Monocytes Relative: 7 %
Neutro Abs: 4.4 10*3/uL (ref 1.7–7.7)
Neutrophils Relative %: 60 %
Platelet Count: 209 10*3/uL (ref 150–400)
RBC: 4.34 MIL/uL (ref 3.87–5.11)
RDW: 13 % (ref 11.5–15.5)
WBC Count: 7.3 10*3/uL (ref 4.0–10.5)
nRBC: 0 % (ref 0.0–0.2)

## 2024-08-19 LAB — CMP (CANCER CENTER ONLY)
ALT: 14 U/L (ref 0–44)
AST: 21 U/L (ref 15–41)
Albumin: 4.1 g/dL (ref 3.5–5.0)
Alkaline Phosphatase: 77 U/L (ref 38–126)
Anion gap: 10 (ref 5–15)
BUN: 13 mg/dL (ref 8–23)
CO2: 24 mmol/L (ref 22–32)
Calcium: 9.2 mg/dL (ref 8.9–10.3)
Chloride: 103 mmol/L (ref 98–111)
Creatinine: 0.86 mg/dL (ref 0.44–1.00)
GFR, Estimated: >60 mL/min
Glucose, Bld: 115 mg/dL — ABNORMAL HIGH (ref 70–99)
Potassium: 3.8 mmol/L (ref 3.5–5.1)
Sodium: 138 mmol/L (ref 135–145)
Total Bilirubin: 0.4 mg/dL (ref 0.0–1.2)
Total Protein: 6.8 g/dL (ref 6.5–8.1)

## 2024-08-19 MED ORDER — LOPERAMIDE HCL 2 MG PO CAPS: 2.0000 mg | ORAL_CAPSULE | ORAL | 2 refills | Status: AC

## 2024-08-19 MED ORDER — OMEPRAZOLE MAGNESIUM 20 MG PO TBEC: 20.0000 mg | DELAYED_RELEASE_TABLET | Freq: Every day | ORAL | 3 refills | Status: AC

## 2024-08-19 MED FILL — Fosaprepitant Dimeglumine For IV Infusion 150 MG (Base Eq): INTRAVENOUS | Qty: 5 | Status: AC

## 2024-08-19 NOTE — Assessment & Plan Note (Addendum)
 Pathology and radiology counseling: Discussed with the patient, the details of pathology including the type of breast cancer,the clinical staging, the significance of ER, PR and HER-2/neu receptors and the implications for treatment. After reviewing the pathology in detail, we proceeded to discuss the different treatment options between surgery, radiation, chemotherapy, antiestrogen therapies.  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 Recommend neoadjuvant chemotherapy TCHP followed by surgery.  Adjuvant treatment plan is to be determined, based on final surgical pathology. Baseline echo showed normal LVEF 60-65% Labs are reviewed and discussed with patient. proceed with cycle 1 TCHP wit D3 GCSF. Recommend Claritin 10mg  daily x 4 days.  Instructions of antiemetics were reviewed with patient in details.

## 2024-08-19 NOTE — Assessment & Plan Note (Addendum)
 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  Ki-67 10% Patient will get neoadjuvant chemotherapy for left breast HER2 positive carcinoma.  After she finishes treatments, she is interested in getting  bilateral lumpectomy with sentinel lymph node biopsy.

## 2024-08-19 NOTE — Assessment & Plan Note (Signed)
 Patient is on Abilify.   Trazodone at bedtime

## 2024-08-19 NOTE — Assessment & Plan Note (Signed)
 Chemotherapy treatment as planned above

## 2024-08-20 ENCOUNTER — Inpatient Hospital Stay

## 2024-08-20 ENCOUNTER — Inpatient Hospital Stay: Admitting: Oncology

## 2024-08-20 ENCOUNTER — Encounter: Payer: Self-pay | Admitting: *Deleted

## 2024-08-20 VITALS — BP 125/73 | HR 74 | Temp 97.0°F | Resp 16

## 2024-08-20 DIAGNOSIS — C50912 Malignant neoplasm of unspecified site of left female breast: Secondary | ICD-10-CM

## 2024-08-20 MED ORDER — SODIUM CHLORIDE 0.9 % IV SOLN
INTRAVENOUS | Status: DC
  Filled 2024-08-20: qty 250

## 2024-08-20 MED ORDER — SODIUM CHLORIDE 0.9 % IV SOLN
840.0000 mg | Freq: Once | INTRAVENOUS | Status: AC
  Administered 2024-08-20: 840 mg via INTRAVENOUS
  Filled 2024-08-20: qty 28

## 2024-08-20 MED ORDER — SODIUM CHLORIDE 0.9 % IV SOLN
663.0000 mg | Freq: Once | INTRAVENOUS | Status: AC
  Administered 2024-08-20: 660 mg via INTRAVENOUS
  Filled 2024-08-20: qty 66

## 2024-08-20 MED ORDER — SODIUM CHLORIDE 0.9 % IV SOLN
75.0000 mg/m2 | Freq: Once | INTRAVENOUS | Status: AC
  Administered 2024-08-20: 142 mg via INTRAVENOUS
  Filled 2024-08-20: qty 14.2

## 2024-08-20 MED ORDER — DIPHENHYDRAMINE HCL 25 MG PO TABS
50.0000 mg | ORAL_TABLET | Freq: Once | ORAL | Status: AC
  Administered 2024-08-20: 50 mg via ORAL
  Filled 2024-08-20: qty 2

## 2024-08-20 MED ORDER — PALONOSETRON HCL INJECTION 0.25 MG/5ML
0.2500 mg | Freq: Once | INTRAVENOUS | Status: AC
  Administered 2024-08-20: 0.25 mg via INTRAVENOUS
  Filled 2024-08-20: qty 5

## 2024-08-20 MED ORDER — TRASTUZUMAB-ANNS CHEMO 150 MG IV SOLR
8.0000 mg/kg | Freq: Once | INTRAVENOUS | Status: AC
  Administered 2024-08-20: 600 mg via INTRAVENOUS
  Filled 2024-08-20: qty 28.57

## 2024-08-20 MED ORDER — ACETAMINOPHEN 325 MG PO TABS
650.0000 mg | ORAL_TABLET | Freq: Once | ORAL | Status: AC
  Administered 2024-08-20: 650 mg via ORAL
  Filled 2024-08-20: qty 2

## 2024-08-20 MED ORDER — DEXAMETHASONE SOD PHOSPHATE PF 10 MG/ML IJ SOLN
10.0000 mg | Freq: Once | INTRAMUSCULAR | Status: AC
  Administered 2024-08-20: 10 mg via INTRAVENOUS
  Filled 2024-08-20: qty 1

## 2024-08-20 MED ORDER — SODIUM CHLORIDE 0.9 % IV SOLN
150.0000 mg | Freq: Once | INTRAVENOUS | Status: AC
  Administered 2024-08-20: 150 mg via INTRAVENOUS
  Filled 2024-08-20: qty 150

## 2024-08-20 NOTE — Patient Instructions (Signed)
 [facility] - A DEPT OF [facility]  Discharge Instructions: Thank you for choosing [facility] to provide your oncology and hematology care.  If you have a lab appointment with the Cancer Center, please go directly to the Cancer Center and check in at the registration area.  Wear comfortable clothing and clothing appropriate for easy access to any Portacath or PICC line.   We strive to give you quality time with your provider. You may need to reschedule your appointment if you arrive late (15 or more minutes).  Arriving late affects you and other patients whose appointments are after yours.  Also, if you miss three or more appointments without notifying the office, you may be dismissed from the clinic at the providers discretion.      For prescription refill requests, have your pharmacy contact our office and allow 72 hours for refills to be completed.    Today you received the following chemotherapy and/or immunotherapy agents trastuzumab /pertuzumab /taxotere /carboplatin        To help prevent nausea and vomiting after your treatment, we encourage you to take your nausea medication as directed.  BELOW ARE SYMPTOMS THAT SHOULD BE REPORTED IMMEDIATELY: *FEVER GREATER THAN 100.4 F (38 C) OR HIGHER *CHILLS OR SWEATING *NAUSEA AND VOMITING THAT IS NOT CONTROLLED WITH YOUR NAUSEA MEDICATION *UNUSUAL SHORTNESS OF BREATH *UNUSUAL BRUISING OR BLEEDING *URINARY PROBLEMS (pain or burning when urinating, or frequent urination) *BOWEL PROBLEMS (unusual diarrhea, constipation, pain near the anus) TENDERNESS IN MOUTH AND THROAT WITH OR WITHOUT PRESENCE OF ULCERS (sore throat, sores in mouth, or a toothache) UNUSUAL RASH, SWELLING OR PAIN  UNUSUAL VAGINAL DISCHARGE OR ITCHING   Items with * indicate a potential emergency and should be followed up as soon as possible or go to the Emergency Department if any problems should occur.  Please show the  CHEMOTHERAPY ALERT CARD or IMMUNOTHERAPY ALERT CARD at check-in to the Emergency Department and triage nurse.  Should you have questions after your visit or need to cancel or reschedule your appointment, please contact [facility] - A DEPT OF [facility]  [phone] and follow the prompts.  Office hours are 8:00 a.m. to 4:30 p.m. Monday - Friday. Please note that voicemails left after 4:00 p.m. may not be returned until the following business day.  We are closed weekends and major holidays. You have access to a nurse at all times for urgent questions. Please call the main number to the clinic [phone] and follow the prompts.  For any non-urgent questions, you may also contact your provider using MyChart. We now offer e-Visits for anyone 34 and older to request care online for non-urgent symptoms. For details visit [email or web address].   Also download the MyChart app! Go to the app store, search MyChart, open the app, select [place], and log in with your MyChart username and password.  Trastuzumab  Injection What is this medication? TRASTUZUMAB  (tras TOO zoo mab) treats breast cancer and stomach cancer. It works by blocking a protein that causes cancer cells to grow and multiply. This helps to slow or stop the spread of cancer cells. This medicine may be used for other purposes; ask your health care provider or pharmacist if you have questions. COMMON BRAND NAME(S): Herceptin , HERCESSI, Herzuma , KANJINTI , Ogivri , Ontruzant , Trazimera  What should I tell my care team before I take this medication? They need to know if you have any of these conditions: Heart failure Lung  disease An unusual or allergic reaction to trastuzumab , other medications, foods, dyes, or preservatives Pregnant or trying to get pregnant Breast-feeding How should I use this medication? This medication is infused into a vein. It is given by your care team in a hospital or clinic  setting. Talk to your care team about the use of this medication in children. Special care may be needed. Overdosage: If you think you have taken too much of this medicine contact a poison control center or emergency room at once. NOTE: This medicine is only for you. Do not share this medicine with others. What if I miss a dose? Keep appointments for follow-up doses. It is important not to miss your dose. Call your care team if you are unable to keep an appointment. What may interact with this medication? Certain types of chemotherapy, such as daunorubicin, doxorubicin, epirubicin, idarubicin This list may not describe all possible interactions. Give your health care provider a list of all the medicines, herbs, non-prescription drugs, or dietary supplements you use. Also tell them if you smoke, drink alcohol, or use illegal drugs. Some items may interact with your medicine. What should I watch for while using this medication? Your condition will be monitored carefully while you are receiving this medication. This medication may make you feel generally unwell. This is not uncommon, as chemotherapy affects healthy cells as well as cancer cells. Report any side effects. Continue your course of treatment even though you feel ill unless your care team tells you to stop. This medication may increase your risk of getting an infection. Call your care team for advice if you get a fever, chills, sore throat, or other symptoms of a cold or flu. Do not treat yourself. Try to avoid being around people who are sick. Avoid taking medications that contain aspirin, acetaminophen , ibuprofen, naproxen, or ketoprofen unless instructed by your care team. These medications can hide a fever. Talk to your care team if you may be pregnant. Serious birth defects can occur if you take this medication during pregnancy and for 7 months after the last dose. You will need a negative pregnancy test before starting this medication.  Contraception is recommended while taking this medication and for 7 months after the last dose. Your care team can help you find the option that works for you. Do not breastfeed while taking this medication and for 7 months after stopping treatment. What side effects may I notice from receiving this medication? Side effects that you should report to your care team as soon as possible: Allergic reactions or angioedema--skin rash, itching or hives, swelling of the face, eyes, lips, tongue, arms, or legs, trouble swallowing or breathing Dry cough, shortness of breath or trouble breathing Heart failure--shortness of breath, swelling of the ankles, feet, or hands, sudden weight gain, unusual weakness or fatigue Infection--fever, chills, cough, or sore throat Infusion reactions--chest pain, shortness of breath or trouble breathing, feeling faint or lightheaded Side effects that usually do not require medical attention (report to your care team if they continue or are bothersome): Diarrhea Dizziness Headache Nausea Trouble sleeping Vomiting This list may not describe all possible side effects. Call your doctor for medical advice about side effects. You may report side effects to FDA at 1-800-FDA-1088. Where should I keep my medication? This medication is given in a hospital or clinic. It will not be stored at home. NOTE: This sheet is a summary. It may not cover all possible information. If you have questions about this medicine, talk  to your doctor, pharmacist, or health care provider.  2025 Elsevier/Gold Standard (2024-05-08 00:00:00)  Pertuzumab  Injection What is this medication? PERTUZUMAB  (per TOOZ ue mab) treats breast cancer. It works by blocking a protein that causes cancer cells to grow and multiply. This helps to slow or stop the spread of cancer cells. It is a monoclonal antibody. This medicine may be used for other purposes; ask your health care provider or pharmacist if you have  questions. COMMON BRAND NAME(S): PERJETA  What should I tell my care team before I take this medication? They need to know if you have any of these conditions: Heart failure An unusual or allergic reaction to pertuzumab , other medications, foods, dyes, or preservatives Pregnant or trying to get pregnant Breast-feeding How should I use this medication? This medication is infused into a vein. It is given by your care team in a hospital or clinic setting. This medication is not approved for use in children. Overdosage: If you think you have taken too much of this medicine contact a poison control center or emergency room at once. NOTE: This medicine is only for you. Do not share this medicine with others. What if I miss a dose? Keep appointments for follow-up doses. It is important not to miss your dose. Call your care team if you are unable to keep an appointment. What may interact with this medication? Interactions are not expected. This list may not describe all possible interactions. Give your health care provider a list of all the medicines, herbs, non-prescription drugs, or dietary supplements you use. Also tell them if you smoke, drink alcohol, or use illegal drugs. Some items may interact with your medicine. What should I watch for while using this medication? Your condition will be monitored carefully while you are receiving this medication. This medication may make you feel generally unwell. This is not uncommon as chemotherapy can affect healthy cells as well as cancer cells. Report any side effects. Continue your course of treatment even though you feel ill unless your care team tells you to stop. Talk to your care team if you may be pregnant. Serious birth defects can occur if you take this medication during pregnancy and for 7 months after the last dose. You will need a negative pregnancy test before starting this medication. Contraception is recommended while taking this medication  and for 7 months after the last dose. Your care team can help you find the option that works for you. Do not breastfeed while taking this medication and for 7 months after the last dose. What side effects may I notice from receiving this medication? Side effects that you should report to your care team as soon as possible: Allergic reactions or angioedema--skin rash, itching or hives, swelling of the face, eyes, lips, tongue, arms, or legs, trouble swallowing or breathing Heart failure--shortness of breath, swelling of the ankles, feet, or hands, sudden weight gain, unusual weakness or fatigue Infusion reactions--chest pain, shortness of breath or trouble breathing, feeling faint or lightheaded Side effects that usually do not require medical attention (report to your care team if they continue or are bothersome): Diarrhea Dry skin Fatigue Hair loss Nausea Vomiting This list may not describe all possible side effects. Call your doctor for medical advice about side effects. You may report side effects to FDA at 1-800-FDA-1088. Where should I keep my medication? This medication is given in a hospital or clinic. It will not be stored at home. NOTE: This sheet is a summary. It may not  cover all possible information. If you have questions about this medicine, talk to your doctor, pharmacist, or health care provider.  2025 Elsevier/Gold Standard (2024-05-08 00:00:00)  Docetaxel  Injection What is this medication? DOCETAXEL  (doe se TAX el) treats some types of cancer. It works by slowing down the growth of cancer cells. This medicine may be used for other purposes; ask your health care provider or pharmacist if you have questions. COMMON BRAND NAME(S): BEIZRAY , Docefrez , Docivyx , Taxotere  What should I tell my care team before I take this medication? They need to know if you have any of these conditions: Kidney disease Liver disease Low white blood cell levels Tingling of the fingers or toes  or other nerve disorder An unusual or allergic reaction to docetaxel , polysorbate 80, other medications, foods, dyes, or preservatives Pregnant or trying to get pregnant Breast-feeding How should I use this medication? This medication is infused into a vein. It is given by your care team in a hospital or clinic setting. Talk to your care team about the use of this medication in children. Special care may be needed. Overdosage: If you think you have taken too much of this medicine contact a poison control center or emergency room at once. NOTE: This medicine is only for you. Do not share this medicine with others. What if I miss a dose? Keep appointments for follow-up doses. It is important not to miss your dose. Call your care team if you are unable to keep an appointment. What may interact with this medication? Do not take this medication with any of the following: Live virus vaccines This medication may also interact with the following: Certain antibiotics, such as clarithromycin, telithromycin Certain antivirals for HIV or hepatitis Certain medications for fungal infections, such as itraconazole, ketoconazole, voriconazole Grapefruit juice Nefazodone Supplements, such as St. John's wort This list may not describe all possible interactions. Give your health care provider a list of all the medicines, herbs, non-prescription drugs, or dietary supplements you use. Also tell them if you smoke, drink alcohol, or use illegal drugs. Some items may interact with your medicine. What should I watch for while using this medication? This medication may make you feel generally unwell. This is not uncommon as chemotherapy can affect healthy cells as well as cancer cells. Report any side effects. Continue your course of treatment even though you feel ill unless your care team tells you to stop. You may need blood work done while you are taking this medication. This medication can cause serious side  effects and infusion reactions. To reduce the risk, your care team may give you other medications to take before receiving this one. Be sure to follow the directions from your care team. This medication may increase your risk of getting an infection. Call your care team for advice if you get a fever, chills, sore throat, or other symptoms of a cold or flu. Do not treat yourself. Try to avoid being around people who are sick. Avoid taking medications that contain aspirin, acetaminophen , ibuprofen, naproxen, or ketoprofen unless instructed by your care team. These medications may hide a fever. Be careful brushing or flossing your teeth or using a toothpick because you may get an infection or bleed more easily. If you have any dental work done, tell your dentist you are receiving this medication. Some products may contain alcohol. Ask your care team if this medication contains alcohol. Be sure to tell all care teams you are taking this medicine. Certain medications, like metronidazole and disulfiram,  can cause an unpleasant reaction when taken with alcohol. The reaction includes flushing, headache, nausea, vomiting, sweating, and increased thirst. The reaction can last from 30 minutes to several hours. This medication may affect your coordination, reaction time, or judgement. Do not drive or operate machinery until you know how this medication affects you. Sit up or stand slowly to reduce the risk of dizzy or fainting spells. Drinking alcohol with this medication can increase the risk of these side effects. Talk to your care team about your risk of cancer. You may be more at risk for certain types of cancer if you take this medication. Talk to your care team if you wish to become pregnant or think you might be pregnant. This medication can cause serious birth defects if taken during pregnancy or if you get pregnant within 2 months after stopping therapy. A negative pregnancy test is required before starting this  medication. A reliable form of contraception is recommended while taking this medication and for 2 months after stopping it. Talk to your care team about reliable forms of contraception. Do not breast-feed while taking this medication and for 1 week after stopping therapy. Use a condom during sex and for 4 months after stopping therapy. Tell your care team right away if you think your partner might be pregnant. This medication can cause serious birth defects. This medication may cause infertility. Talk to your care team if you are concerned about your fertility. What side effects may I notice from receiving this medication? Side effects that you should report to your care team as soon as possible: Allergic reactions--skin rash, itching, hives, swelling of the face, lips, tongue, or throat Change in vision such as blurry vision, seeing halos around lights, vision loss Infection--fever, chills, cough, or sore throat Infusion reactions--chest pain, shortness of breath or trouble breathing, feeling faint or lightheaded Low red blood cell level--unusual weakness or fatigue, dizziness, headache, trouble breathing Pain, tingling, or numbness in the hands or feet Painful swelling, warmth, or redness of the skin, blisters or sores at the infusion site Redness, blistering, peeling, or loosening of the skin, including inside the mouth Sudden or severe stomach pain, bloody diarrhea, fever, nausea, vomiting Swelling of the ankles, hands, or feet Tumor lysis syndrome (TLS)--nausea, vomiting, diarrhea, decrease in the amount of urine, dark urine, unusual weakness or fatigue, confusion, muscle pain or cramps, fast or irregular heartbeat, joint pain Unusual bruising or bleeding Side effects that usually do not require medical attention (report to your care team if they continue or are bothersome): Change in nail shape, thickness, or color Change in taste Hair loss Increased tears This list may not describe all  possible side effects. Call your doctor for medical advice about side effects. You may report side effects to FDA at 1-800-FDA-1088. Where should I keep my medication? This medication is given in a hospital or clinic. It will not be stored at home. NOTE: This sheet is a summary. It may not cover all possible information. If you have questions about this medicine, talk to your doctor, pharmacist, or health care provider.  2025 Elsevier/Gold Standard (2024-05-08 00:00:00)  Carboplatin  Injection What is this medication? CARBOPLATIN  (KAR boe pla tin) treats some types of cancer. It works by slowing down the growth of cancer cells. This medicine may be used for other purposes; ask your health care provider or pharmacist if you have questions. COMMON BRAND NAME(S): Paraplatin  What should I tell my care team before I take this medication? They need to  know if you have any of these conditions: Blood disorders Hearing problems Kidney disease Recent or ongoing radiation therapy An unusual or allergic reaction to carboplatin , cisplatin, other medications, foods, dyes, or preservatives Pregnant or trying to get pregnant Breast-feeding How should I use this medication? This medication is injected into a vein. It is given by your care team in a hospital or clinic setting. Talk to your care team about the use of this medication in children. Special care may be needed. Overdosage: If you think you have taken too much of this medicine contact a poison control center or emergency room at once. NOTE: This medicine is only for you. Do not share this medicine with others. What if I miss a dose? Keep appointments for follow-up doses. It is important not to miss your dose. Call your care team if you are unable to keep an appointment. What may interact with this medication? Medications for seizures Some antibiotics, such as amikacin, gentamicin, neomycin, streptomycin, tobramycin Vaccines This list may not  describe all possible interactions. Give your health care provider a list of all the medicines, herbs, non-prescription drugs, or dietary supplements you use. Also tell them if you smoke, drink alcohol, or use illegal drugs. Some items may interact with your medicine. What should I watch for while using this medication? Your condition will be monitored carefully while you are receiving this medication. You may need blood work while taking this medication. This medication may make you feel generally unwell. This is not uncommon, as chemotherapy can affect healthy cells as well as cancer cells. Report any side effects. Continue your course of treatment even though you feel ill unless your care team tells you to stop. In some cases, you may be given additional medications to help with side effects. Follow all directions for their use. This medication may increase your risk of getting an infection. Call your care team for advice if you get a fever, chills, sore throat, or other symptoms of a cold or flu. Do not treat yourself. Try to avoid being around people who are sick. Avoid taking medications that contain aspirin, acetaminophen , ibuprofen, naproxen, or ketoprofen unless instructed by your care team. These medications may hide a fever. Be careful brushing or flossing your teeth or using a toothpick because you may get an infection or bleed more easily. If you have any dental work done, tell your dentist you are receiving this medication. Talk to your care team if you wish to become pregnant or think you might be pregnant. This medication can cause serious birth defects. Talk to your care team about effective forms of contraception. Do not breast-feed while taking this medication. What side effects may I notice from receiving this medication? Side effects that you should report to your care team as soon as possible: Allergic reactions--skin rash, itching, hives, swelling of the face, lips, tongue, or  throat Infection--fever, chills, cough, sore throat, wounds that don't heal, pain or trouble when passing urine, general feeling of discomfort or being unwell Low red blood cell level--unusual weakness or fatigue, dizziness, headache, trouble breathing Pain, tingling, or numbness in the hands or feet, muscle weakness, change in vision, confusion or trouble speaking, loss of balance or coordination, trouble walking, seizures Unusual bruising or bleeding Side effects that usually do not require medical attention (report to your care team if they continue or are bothersome): Hair loss Nausea Unusual weakness or fatigue Vomiting This list may not describe all possible side effects. Call your doctor for  medical advice about side effects. You may report side effects to FDA at 1-800-FDA-1088. Where should I keep my medication? This medication is given in a hospital or clinic. It will not be stored at home. NOTE: This sheet is a summary. It may not cover all possible information. If you have questions about this medicine, talk to your doctor, pharmacist, or health care provider.  2024 Elsevier/Gold Standard (2021-10-25 00:00:00)

## 2024-08-21 ENCOUNTER — Telehealth: Payer: Self-pay

## 2024-08-21 ENCOUNTER — Inpatient Hospital Stay

## 2024-08-21 NOTE — Telephone Encounter (Signed)
Telephone call to patient for follow up after receiving first infusion.   No answer but left message stating we were calling to check on them.  Encouraged patient to call for any questions or concerns.   

## 2024-08-22 ENCOUNTER — Inpatient Hospital Stay

## 2024-08-22 DIAGNOSIS — C50912 Malignant neoplasm of unspecified site of left female breast: Secondary | ICD-10-CM

## 2024-08-22 MED ORDER — PEGFILGRASTIM-CBQV 6 MG/0.6ML ~~LOC~~ SOSY
6.0000 mg | PREFILLED_SYRINGE | Freq: Once | SUBCUTANEOUS | Status: AC
  Administered 2024-08-22: 6 mg via SUBCUTANEOUS
  Filled 2024-08-22: qty 0.6

## 2024-08-26 ENCOUNTER — Inpatient Hospital Stay

## 2024-08-26 ENCOUNTER — Inpatient Hospital Stay: Admitting: Oncology

## 2024-09-10 ENCOUNTER — Inpatient Hospital Stay: Admitting: Oncology

## 2024-09-10 ENCOUNTER — Inpatient Hospital Stay

## 2024-09-12 ENCOUNTER — Inpatient Hospital Stay

## 2024-09-17 ENCOUNTER — Inpatient Hospital Stay

## 2024-09-22 ENCOUNTER — Ambulatory Visit: Admitting: Family
